# Patient Record
Sex: Female | Born: 1980 | Race: White | Hispanic: No | Marital: Married | Smoking: Never smoker
Health system: Southern US, Community
[De-identification: ages and names within clinical notes are randomized; demographics above are authoritative.]

## PROBLEM LIST (undated history)

## (undated) DIAGNOSIS — Z8659 Personal history of other mental and behavioral disorders: Secondary | ICD-10-CM

## (undated) DIAGNOSIS — Z9889 Other specified postprocedural states: Secondary | ICD-10-CM

## (undated) DIAGNOSIS — R112 Nausea with vomiting, unspecified: Secondary | ICD-10-CM

## (undated) DIAGNOSIS — Z8619 Personal history of other infectious and parasitic diseases: Secondary | ICD-10-CM

## (undated) HISTORY — PX: OTHER SURGICAL HISTORY: SHX169

## (undated) HISTORY — DX: Personal history of other mental and behavioral disorders: Z86.59

## (undated) HISTORY — DX: Personal history of other infectious and parasitic diseases: Z86.19

## (undated) HISTORY — PX: DILATION AND CURETTAGE OF UTERUS: SHX78

---
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
## 2003-12-04 ENCOUNTER — Ambulatory Visit: Payer: Self-pay | Admitting: Sports Medicine

## 2003-12-18 ENCOUNTER — Ambulatory Visit: Payer: Self-pay | Admitting: Sports Medicine

## 2003-12-31 ENCOUNTER — Ambulatory Visit: Payer: Self-pay | Admitting: Sports Medicine

## 2004-01-22 ENCOUNTER — Ambulatory Visit: Payer: Self-pay | Admitting: Sports Medicine

## 2004-02-12 ENCOUNTER — Ambulatory Visit: Payer: Self-pay | Admitting: Sports Medicine

## 2004-03-04 ENCOUNTER — Ambulatory Visit: Payer: Self-pay | Admitting: Sports Medicine

## 2004-04-08 ENCOUNTER — Ambulatory Visit: Payer: Self-pay | Admitting: Sports Medicine

## 2004-04-24 ENCOUNTER — Ambulatory Visit: Payer: Self-pay | Admitting: Sports Medicine

## 2004-05-26 ENCOUNTER — Ambulatory Visit: Payer: Self-pay | Admitting: Sports Medicine

## 2004-07-21 ENCOUNTER — Ambulatory Visit: Payer: Self-pay | Admitting: Sports Medicine

## 2004-12-01 ENCOUNTER — Ambulatory Visit: Payer: Self-pay | Admitting: Sports Medicine

## 2005-01-29 ENCOUNTER — Ambulatory Visit: Payer: Self-pay | Admitting: Sports Medicine

## 2005-08-10 ENCOUNTER — Ambulatory Visit: Payer: Self-pay | Admitting: Family Medicine

## 2006-05-09 ENCOUNTER — Telehealth (INDEPENDENT_AMBULATORY_CARE_PROVIDER_SITE_OTHER): Payer: Self-pay | Admitting: *Deleted

## 2006-05-11 ENCOUNTER — Ambulatory Visit: Payer: Self-pay | Admitting: Sports Medicine

## 2006-05-11 DIAGNOSIS — M722 Plantar fascial fibromatosis: Secondary | ICD-10-CM | POA: Insufficient documentation

## 2006-05-11 DIAGNOSIS — M2140 Flat foot [pes planus] (acquired), unspecified foot: Secondary | ICD-10-CM | POA: Insufficient documentation

## 2006-05-11 DIAGNOSIS — M66879 Spontaneous rupture of other tendons, unspecified ankle and foot: Secondary | ICD-10-CM | POA: Insufficient documentation

## 2006-10-11 ENCOUNTER — Ambulatory Visit: Payer: Self-pay | Admitting: Sports Medicine

## 2007-01-03 ENCOUNTER — Ambulatory Visit: Payer: Self-pay | Admitting: Sports Medicine

## 2007-01-03 DIAGNOSIS — M629 Disorder of muscle, unspecified: Secondary | ICD-10-CM | POA: Insufficient documentation

## 2007-02-27 ENCOUNTER — Ambulatory Visit: Payer: Self-pay | Admitting: Family Medicine

## 2007-02-27 ENCOUNTER — Telehealth (INDEPENDENT_AMBULATORY_CARE_PROVIDER_SITE_OTHER): Payer: Self-pay | Admitting: *Deleted

## 2007-03-08 ENCOUNTER — Ambulatory Visit: Payer: Self-pay | Admitting: Sports Medicine

## 2007-03-08 DIAGNOSIS — M25569 Pain in unspecified knee: Secondary | ICD-10-CM | POA: Insufficient documentation

## 2007-03-08 DIAGNOSIS — M21619 Bunion of unspecified foot: Secondary | ICD-10-CM | POA: Insufficient documentation

## 2007-07-14 ENCOUNTER — Ambulatory Visit: Payer: Self-pay | Admitting: Sports Medicine

## 2007-07-14 DIAGNOSIS — M66259 Spontaneous rupture of extensor tendons, unspecified thigh: Secondary | ICD-10-CM | POA: Insufficient documentation

## 2007-07-14 DIAGNOSIS — M76899 Other specified enthesopathies of unspecified lower limb, excluding foot: Secondary | ICD-10-CM | POA: Insufficient documentation

## 2010-03-10 NOTE — Assessment & Plan Note (Signed)
Summary: fu wp   Vital Signs:  Patient Profile:   30 Years Old Female Weight:      129 pounds Pulse rate:   60 / minute BP sitting:   104 / 68  Vitals Entered By: Lillia Pauls CMA (July 14, 2007 10:05 AM)                 Chief Complaint:  L LATERAL KNEE PAIN X 2 MOS S/P SOCCER INJURY.  History of Present Illness: Patient with hx of tackling in soccer match left leg extended felt pop on lateral side adn fell with leg in fleed position very painful and hard to straighten out completely no great amount of swelling This happened in march  took a few days off continued to run on treadmill feels unsteady and weak on left leg stil gets tight and cannot run hard on left although she has run as long as 2 hours!  no locking no giving way except on TM maybe some slight swelling feels tight        Physical Exam  General:     Well-developed,well-nourished,in no acute distress; alert,appropriate and cooperative throughout examination Head:     Normocephalic and atraumatic without obvious abnormalities. No apparent alopecia or balding. Msk:     left knee knee exam shows no effusion; stable ligaments; negative Mcmurray's and provocative meniscal tests;  however she cannot do a bounde home test or get to full extension on left althought this isnon painful  patellar compression; patellar and quadriceps tendons unremarkable.  there is some puffiness in the area above the knee along vastus lateralis about 5 cms up  Additional Exam:     MS [place] Left Knee significant fluid remains in suprapatellar pouch on left small partial tear of vast lateralis tendon where this comes into lat patella patellar and quad tendons nl  meniscus visualized pretty well along periphery and no areas look abnorm no intraarticular effusion    Impression & Recommendations:  Problem # 1:  RUPTURE, QUADRICEPS TENDON (ICD-727.65) I think she has a partial tear of vastus lateralis tendon  begin  series of quad exercises xtrain on bike  steadily rebuild vas lateralis strength  reck in 4 to 6 weeks  40 mins Orders: [facility]- Est  Level 4 (11914)   Problem # 2:  BURSITIS, LEFT KNEE (ICD-726.60) tendon injury has led to a secondary bursitis in suprapatellar pouch  needs compression for this  use ace wrap when exercising cut training to 60 % Orders: [facility]- Est  Level 4 (78295)     ]

## 2010-03-10 NOTE — Assessment & Plan Note (Signed)
Summary: fu on orthotics wp   Vital Signs:  Patient Profile:   30 Years Old Female Weight:      128 pounds Pulse rate:   61 / minute BP sitting:   113 / 71  Vitals Entered By: Lillia Pauls CMA (October 11, 2006 2:04 PM)                 Chief Complaint:  R MEDIAL ANKLE PAIN.  History of Present Illness: S: Patient is a 30 y/o female with a h/o right posterior tibilais tendon tear 4/08.  She took some time off after that and felt better.  She then started training again and was able to run in a marathon in July.  Pain below the right medial ankle started back about 2 months ago. At first the pain occured later in the day many hours after running and would cause her to limp.  Now within the last week pain has occured during her run usually around the 9th or 10th mile when she is tiring.   note training level is now at 70 miles per week whichis her highest yet and her quality workouts at low 6:15 pace or so.        Physical Exam  General:     Well-developed,well-nourished,in no acute distress; alert,appropriate and cooperative throughout examination Head:     Normocephalic and atraumatic without obvious abnormalities. No apparent alopecia or balding. Ears:     cong external deformities Msk:     R foot: pes planus and pronation on inspection right foot turns slightly inward with running very nice running gait without limp however Nl ROM TTP just inferior to medial calcaneus No pain with extension of great toe.  TTP inferior to medial malleolus in area of post tibial tendon.      Impression & Recommendations:  Problem # 1:  ANKLE TENDON RUPTURE (ICD-727.68) Assessment: Improved Imaged tendon with u/s: posterior tendon is still thickekned near the ankle joint but no longer surrounded by fluid.  There is now a nodule of scar tissue.  So overall, she appears to have healed some.  Pain is likely coming from increase wobbling during periods of fatigue.  Recommneded she  cut out one work-out a week.  Also recommeneded decreasing the pace of tempo runs.  Patient to continue icing after work-out.  Also gave her sample of Voltaren gel 0.1%.   Orders: [facility]- Est  Level 4 (16109)

## 2010-03-10 NOTE — Assessment & Plan Note (Signed)
Summary: new orthotics/per neal/el   Vital Signs:  Patient Profile:   30 Years Old Female Pulse rate:   65 / minute BP sitting:   135 / 72  Vitals Entered By: Lillia Pauls CMA (March 08, 2007 10:54 AM)                 Chief Complaint:  ORTHOTICS.  History of Present Illness: Patient is a serious distance runner training up to 80 miles per week now getting more arch pain again  more tendon irritation lt foot has bleeding and callus formation on dorsum of bunionette orthotics now > 68 year old and feel less supportive  also RT knee pain developed 24 to 48 hours after she fell on trail bumped patella and now hurts on lat upper aspect no swelling, giving way, etc        Physical Exam  General:     Well-developed,well-nourished,in no acute distress; alert,appropriate and cooperative throughout examination Head:     Normocephalic and atraumatic without obvious abnormalities. No apparent alopecia or balding. Msk:     marked collapse of tranverse arch with MTsalgia also has bunionettes bilat LT > RT with this and actually has callus and spur formation on dorsum of LT foot at 5th MTP jt long arch collapse and pronation at rest  leg length is equal  RT knee knee exam shows no effusion; stable ligaments; negative Mcmurray's and provocative meniscal tests; non painful patellar compression; patellar and quadriceps tendons unremarkable tender area at upper outer facet of patellar groove no visible bruising but hurts with flexion     Impression & Recommendations:  Problem # 1:  PES PLANUS (ICD-734)  Orders: Metatarsal pads- [facility] (E4540) [facility]- Est  Level 4 (98119) [facility]- [facility] (J4782)  Patient was fitted for a standard, cushioned, semi-rigid orthotic.  The orthotic was heated and the patient stood on the orthotic blank positioned on the orthotic stand. The patient was positioned in subtalar neutral position and 10 degrees of ankle dorsiflexion in a weight  bearing stance. After completion of molding a stable based was applied to the orthotic blank.   The blank was ground to a stable position for weight bearing. size 9 red cambray cushioned base  blue semi rigid polymer posting none additional orthotic padding  bilat MT pads   Problem # 2:  KNEE PAIN, RIGHT (ICD-719.46)  Orders: Knee Strap- [facility] (N5621)  will try to take pressure off contused area ice   Problem # 3:  BUNIONETTE (ICD-727.1) will try to support with MT pads really needs to try to adjuxt to these  Re ck in 1 to 2 mos Orders: [facility]- Est  Level 4 (30865) [facility]- Orthotic [employer] [phone])      ]

## 2010-03-10 NOTE — Progress Notes (Signed)
Summary: Orthotic issues  Phone Note Call from Patient Call back at Home Phone [phone]   Reason for Call: Talk to Nurse Summary of Call: Pt is requesting to speak with Dr. Darrick Penna nurse about her orthotics hurting her feet and possibly needing them adjusted. Initial call taken by: Haydee Salter,  February 27, 2007 9:11 AM  Follow-up for Phone Call        pt to come in so dr neal came make adjustments as need Follow-up by: Lillia Pauls CMA,  February 27, 2007 2:01 PM         Appended Document: Orthotic issues      History of Present Illness: had orthotics made some time 26 y? ago). Worked OK. ran regularly and then not so regularly for a while. last several weeks has restarted exercise and is haiving terrible foot pain.        Physical Exam  1st MCP joint has small ulcerated lesion w some surrounding erythema--not true cellulitis.    Impression & Recommendations:  Problem # 1:  PES PLANUS (ICD-734) orthotics need to be revised but she needs to  let this soft tissue ulcer heal first. No running 5 days, we put donut bandage on joint, topical abx 3 days and then f/u next week for new orthotics.   Patient Instructions: 1)  appt with dr fields on wednesday 03/08/07 at 10:30am    ]  Appended Document: no charge    Clinical Lists Changes  Orders: Added new Service order of No Charge Patient Arrived (NCPA0) (NCPA0) - Signed

## 2010-03-10 NOTE — Assessment & Plan Note (Signed)
Summary: FU/KH   Vital Signs:  Patient Profile:   31 Years Old Female Weight:      127 pounds Pulse rate:   73 / minute BP sitting:   105 / 66  Vitals Entered By: Lillia Pauls CMA (May 11, 2006 9:19 AM)               Chief Complaint:  R ANKLE INJURY X SUN.  History of Present Illness: Mackenzie Delgado is a 30 yr old F runner who presents today with R heel pain X several months and soreness/bruising posterior to her R medial malleolus.  She ran a 1/2 marathon on 3/30 without difficulty, and than played a soccer game.  Her heel became worse and after the game, she noticed the bruising and soreness in her medial ankle.  Her heel pain is localized to her medial right heel, starts with a stabbing pain for the first few minutes of her run, then goes away.  Afterwards, she often has heel pain/soreness for the rest of the day.  She wears orthotics in her running shoes, but not in her cleats or her racing shoes.  She was able to complete a 12 mile run yesterday without much difficulty.  No meds, some icing.  No weakness, numbness, or tingling.  She is currently running 54mi/wk and training for a marathon on May 4th.    Past Medical History:    Hx of L piriformis syndrome    Hx of L IT band syndrome      Physical Exam  General:     Well appearing F in NAD Msk:     R foot: pes planus and pronation on inspection Nl ROM TTP just posterior to medial calcaneus at the insertion of the plantar fascia.   No pain with extension of great toe.  TTP posterior to medial malleolus in area of post tibial tendon.  Not TTP at PT insertion or proximal to malleolus.  Additional Exam:     Soft tissue Ultrasound evaluation:  Posterior tibilais tendon- Ultrasound evaluation with pt in seated position of the R posterior tibialis tendon this was tracked along the course of the posterior tibial groove and on along the tarsal tunnel and the insertion of the tendon on the navicular.  Measurement of post tib  tendon was .36mm near the site of attachment to the navicular and proximal to the tear.  An intratedinous defect and fluid collection was appreciated just posterior and underneath the medial malleolus and the tendon measured .44mm suggesting an approximately 20% increase in tendon size.  This was compared to left Post tib tendon which showed none of the pathological findings noted above   Plantar fascia- Ultrasound evaluation of R plantar fascia with pt in seated position.  Visualized attachment point to calcaneus, and measured .44mm at thickest point.  This is in excess of normal greatest thickness of .40 representing mild swelling    Impression & Recommendations:  Problem # 1:  ANKLE TENDON RUPTURE (ICD-727.68) Assessment: New Pt likely has  ~20% tear by ultrasound of her post tibilais tendon posterior to her medial malleolus.  Went over training plan with taper for her upcoming marathon.  Pt told to decrease activity if that area becomes sore.  She is to ice it after each run.  She was given exercises to do including standing pigeon toe raises, then drop raises on a step- first using 1 leg to go down and 2 legs to get up progressing to using 1 leg  down and 1 leg up. She is to return if her symptoms worsen, and was counseled on the possibilty of complete tendon rupture.  Pt verbalized agreement with plan.  max training 45 miles this  week/ 50 next wk and limt long run to 20 mi 40 miles 3rd wk and limit LR to 14 mi taper 4th wk Orders: [facility]- Est  Level 4 (16109)   Problem # 2:  PLANTAR FASCIITIS (ICD-728.71) Assessment: New Pt counseled to use orthotics in her shoes for longer runs- which will neccessitate new shoes in her case.   Orders: [facility]- Est  Level 4 (60454)   Problem # 3:  PES PLANUS (ICD-734) Assessment: Unchanged Orthotic corrections made to give more cushion in the heel and along medial aspect of orthotic.  Orders: [facility]- Est  Level 4 (09811)

## 2010-03-10 NOTE — Assessment & Plan Note (Signed)
Summary: knee injury/el   Vital Signs:  Patient Profile:   30 Years Old Female Pulse rate:   57 / minute BP sitting:   120 / 72  Vitals Entered By: Lillia Pauls CMA (January 03, 2007 3:14 PM)                 Chief Complaint:  recurrent IT BAND issues on L X 3 WKS.  History of Present Illness: Pt. c/o 3 weeks of left IT band pain that occurred while running the [employer]. Pt. felt tightness diffusely around mile 13-14, but she finished the race despite having to walk a bit. She now feels pain with even small amounts of running or even water running. Pt. has been doing strengthening exercises for this problem using recommended exercises for her IT band syndrome that she had years ago.        Physical Exam  General:     alert and well-developed.     Knee Exam  Skin:    Intact, no scars, lesions, rashes, cafe au lait spots, or bruising.    Inspection:     No deformity, ecchymosis or swelling.   Palpation:    Non-tender to palpation over medial joint line, lateral joint line, parapatellar, condylar, patellar tendon, or Pes bursa.   Special tests:    Neg. McMurray testing on left  Anterior drawer:    Left negative Posterior drawer:    Left negative MCL:    Left negative LCL:    Left negative   Hip Exam  Skin:    Intact, no scars,lesions,rashes,cafe au lait spots,bruising.  Inspection:    No deformity, ecchymosis or swelling.   Palpation:    ttp over hip external rotators on the left as well as the left IT band approximately midway down the thigh mild decrease in strength left hip rotators and slight tendernessover piriformis   Pt. has only very mildly decreased hip abd. strength on her affected side compared to the unaffected side. Pt. seems to have a slightly abd. anterior tilt to her pelvis. Pt.'s running gait was assessed. She has a perfect running form, and there were no abnormalities noted.      Impression &  Recommendations:  Problem # 1:  ITBS, LEFT KNEE (ICD-728.89) IT band syndrome of the left side. this had occurred greater than 2 years ago but had not flared since suspect cold/  prolonged sitting before marathon both contributed as she started race tight  resume strength and stretching program try to ease back into running afrer 3 more weeks of rest Orders: [facility]- Est Level  3 (16109)    Patient Instructions: 1)  Pt. is to discontinue running for 3 weeks. She is to do the recommended IT band strengthening/stretching programs. Pt. is to do the exercise bike and take approximately 2 yoga classes a week.    ]

## 2010-03-10 NOTE — Progress Notes (Signed)
Summary: Appt  Phone Note Call from Patient Call back at Home Phone [phone]   Reason for Call: Talk to Nurse Summary of Call: pt is needing to be seen by Dr Darrick Penna asap, has a marathon in a couple of weeks and thinks she has injured her foot Initial call taken by: Haydee Salter,  May 09, 2006 8:42 AM  Follow-up for Phone Call        APPT MADE WITH [facility]ETT ON 4.2.8 @ 9:30 Follow-up by: Lillia Pauls CMA,  May 09, 2006 12:15 PM

## 2016-02-09 NOTE — L&D Delivery Note (Signed)
Delivery Note Patient pushed well for < 10 minutes.  At 4:51 PM a viable female was delivered via Vaginal, Spontaneous (Presentation: OA ).  APGAR: 8, 9; weight 5 lb 13.3 oz (2645 g).   Placenta status: Spontaneous, in tact .  Cord: 3V with the following complications: None.  Cord pH: n/a  Anesthesia:  None Episiotomy: None Lacerations: Vaginal Suture Repair: 3.0 vicryl rapide figure of eight stitch Est. Blood Loss (mL): 200  Mom to postpartum.  Baby to Couplet care / Skin to Skin.  [facility] Medical CenterDYANNA Delgado [name] 12/29/2016, 7:58 PM

## 2016-07-18 LAB — OB RESULTS CONSOLE ANTIBODY SCREEN: Antibody Screen: NEGATIVE

## 2016-07-18 LAB — OB RESULTS CONSOLE HEPATITIS B SURFACE ANTIGEN: Hepatitis B Surface Ag: NEGATIVE

## 2016-07-18 LAB — OB RESULTS CONSOLE RUBELLA ANTIBODY, IGM: Rubella: IMMUNE

## 2016-07-18 LAB — OB RESULTS CONSOLE GC/CHLAMYDIA
Chlamydia: NEGATIVE
Gonorrhea: NEGATIVE

## 2016-07-18 LAB — OB RESULTS CONSOLE ABO/RH: RH Type: POSITIVE

## 2016-07-18 LAB — OB RESULTS CONSOLE RPR: RPR: NONREACTIVE

## 2016-07-18 LAB — OB RESULTS CONSOLE HIV ANTIBODY (ROUTINE TESTING): HIV: NONREACTIVE

## 2016-12-09 LAB — OB RESULTS CONSOLE GBS: GBS: NEGATIVE

## 2016-12-29 ENCOUNTER — Other Ambulatory Visit: Payer: Self-pay

## 2016-12-29 ENCOUNTER — Inpatient Hospital Stay (HOSPITAL_COMMUNITY): Payer: BC Managed Care – PPO | Admitting: Anesthesiology

## 2016-12-29 ENCOUNTER — Encounter (HOSPITAL_COMMUNITY): Payer: Self-pay | Admitting: Anesthesiology

## 2016-12-29 ENCOUNTER — Encounter (HOSPITAL_COMMUNITY): Payer: Self-pay | Admitting: *Deleted

## 2016-12-29 ENCOUNTER — Inpatient Hospital Stay (HOSPITAL_COMMUNITY)
Admission: AD | Admit: 2016-12-29 | Discharge: 2016-12-30 | DRG: 807 | Disposition: A | Discharge status: Home / Self Care | Payer: BC Managed Care – PPO | Source: Ambulatory Visit | Attending: Obstetrics | Admitting: Obstetrics

## 2016-12-29 ENCOUNTER — Inpatient Hospital Stay (HOSPITAL_COMMUNITY)
Admission: AD | Admit: 2016-12-29 | Discharge: 2016-12-29 | Discharge status: Absent without leave | Payer: Self-pay | Attending: Obstetrics | Admitting: Obstetrics

## 2016-12-29 DIAGNOSIS — Z3A37 37 weeks gestation of pregnancy: Secondary | ICD-10-CM

## 2016-12-29 DIAGNOSIS — Z3483 Encounter for supervision of other normal pregnancy, third trimester: Secondary | ICD-10-CM | POA: Diagnosis present

## 2016-12-29 HISTORY — DX: Nausea with vomiting, unspecified: R11.2

## 2016-12-29 HISTORY — DX: Other specified postprocedural states: Z98.890

## 2016-12-29 LAB — TYPE AND SCREEN
ABO/RH(D): B POS
Antibody Screen: NEGATIVE

## 2016-12-29 LAB — CBC
HCT: 42.2 % (ref 36.0–46.0)
Hemoglobin: 14.5 g/dL (ref 12.0–15.0)
MCH: 31.1 pg (ref 26.0–34.0)
MCHC: 34.4 g/dL (ref 30.0–36.0)
MCV: 90.6 fL (ref 78.0–100.0)
Platelets: 156 10*3/uL (ref 150–400)
RBC: 4.66 MIL/uL (ref 3.87–5.11)
RDW: 12.6 % (ref 11.5–15.5)
WBC: 12 10*3/uL — ABNORMAL HIGH (ref 4.0–10.5)

## 2016-12-29 LAB — ABO/RH: ABO/RH(D): B POS

## 2016-12-29 MED ORDER — OXYCODONE HCL 5 MG PO TABS: 5.0000 mg | ORAL_TABLET | ORAL | Status: DC | PRN

## 2016-12-29 MED ORDER — WITCH HAZEL-GLYCERIN EX PADS: 1.0000 "application " | MEDICATED_PAD | CUTANEOUS | Status: DC | PRN

## 2016-12-29 MED ORDER — PHENYLEPHRINE 40 MCG/ML (10ML) SYRINGE FOR IV PUSH (FOR BLOOD PRESSURE SUPPORT)
80.0000 ug | PREFILLED_SYRINGE | INTRAVENOUS | Status: DC | PRN
Start: 2016-12-29 — End: 2016-12-29
  Filled 2016-12-29: qty 5
  Filled 2016-12-29: qty 10

## 2016-12-29 MED ORDER — FENTANYL 2.5 MCG/ML BUPIVACAINE 1/10 % EPIDURAL INFUSION (WH - ANES)
14.0000 mL/h | INTRAMUSCULAR | Status: DC | PRN
  Administered 2016-12-29 (×2): 14 mL/h via EPIDURAL
  Filled 2016-12-29: qty 100

## 2016-12-29 MED ORDER — COCONUT OIL OIL: 1.0000 "application " | TOPICAL_OIL | Status: DC | PRN

## 2016-12-29 MED ORDER — ONDANSETRON HCL 4 MG PO TABS: 4.0000 mg | ORAL_TABLET | ORAL | Status: DC | PRN

## 2016-12-29 MED ORDER — OXYCODONE-ACETAMINOPHEN 5-325 MG PO TABS: 1.0000 | ORAL_TABLET | ORAL | Status: DC | PRN

## 2016-12-29 MED ORDER — OXYTOCIN 40 UNITS IN LACTATED RINGERS INFUSION - SIMPLE MED
2.5000 [IU]/h | INTRAVENOUS | Status: DC
  Filled 2016-12-29: qty 1000

## 2016-12-29 MED ORDER — EPHEDRINE 5 MG/ML INJ
10.0000 mg | INTRAVENOUS | Status: DC | PRN
  Filled 2016-12-29: qty 2

## 2016-12-29 MED ORDER — LIDOCAINE HCL (PF) 1 % IJ SOLN
30.0000 mL | INTRAMUSCULAR | Status: DC | PRN
  Filled 2016-12-29: qty 30

## 2016-12-29 MED ORDER — TETANUS-DIPHTH-ACELL PERTUSSIS 5-2.5-18.5 LF-MCG/0.5 IM SUSP: 0.5000 mL | Freq: Once | INTRAMUSCULAR | Status: DC

## 2016-12-29 MED ORDER — ONDANSETRON HCL 4 MG/2ML IJ SOLN: 4.0000 mg | Freq: Four times a day (QID) | INTRAMUSCULAR | Status: DC | PRN

## 2016-12-29 MED ORDER — OXYCODONE-ACETAMINOPHEN 5-325 MG PO TABS: 2.0000 | ORAL_TABLET | ORAL | Status: DC | PRN

## 2016-12-29 MED ORDER — PHENYLEPHRINE 40 MCG/ML (10ML) SYRINGE FOR IV PUSH (FOR BLOOD PRESSURE SUPPORT)
80.0000 ug | PREFILLED_SYRINGE | INTRAVENOUS | Status: DC | PRN
  Filled 2016-12-29: qty 5

## 2016-12-29 MED ORDER — DIPHENHYDRAMINE HCL 25 MG PO CAPS: 25.0000 mg | ORAL_CAPSULE | Freq: Four times a day (QID) | ORAL | Status: DC | PRN

## 2016-12-29 MED ORDER — IBUPROFEN 600 MG PO TABS
600.0000 mg | ORAL_TABLET | Freq: Four times a day (QID) | ORAL | Status: DC
  Administered 2016-12-29 – 2016-12-30 (×4): 600 mg via ORAL
  Filled 2016-12-29 (×5): qty 1

## 2016-12-29 MED ORDER — BENZOCAINE-MENTHOL 20-0.5 % EX AERO: 1.0000 "application " | INHALATION_SPRAY | CUTANEOUS | Status: DC | PRN

## 2016-12-29 MED ORDER — ONDANSETRON HCL 4 MG/2ML IJ SOLN: 4.0000 mg | INTRAMUSCULAR | Status: DC | PRN

## 2016-12-29 MED ORDER — DIBUCAINE 1 % RE OINT: 1.0000 "application " | TOPICAL_OINTMENT | RECTAL | Status: DC | PRN

## 2016-12-29 MED ORDER — OXYCODONE HCL 5 MG PO TABS
10.0000 mg | ORAL_TABLET | ORAL | Status: DC | PRN
  Filled 2016-12-29: qty 2

## 2016-12-29 MED ORDER — SIMETHICONE 80 MG PO CHEW: 80.0000 mg | CHEWABLE_TABLET | ORAL | Status: DC | PRN

## 2016-12-29 MED ORDER — ACETAMINOPHEN 325 MG PO TABS: 650.0000 mg | ORAL_TABLET | ORAL | Status: DC | PRN

## 2016-12-29 MED ORDER — LIDOCAINE HCL (PF) 1 % IJ SOLN
INTRAMUSCULAR | Status: DC | PRN
  Administered 2016-12-29: 13 mL via EPIDURAL

## 2016-12-29 MED ORDER — SENNOSIDES-DOCUSATE SODIUM 8.6-50 MG PO TABS: 2.0000 | ORAL_TABLET | ORAL | Status: DC

## 2016-12-29 MED ORDER — LACTATED RINGERS IV SOLN: 500.0000 mL | INTRAVENOUS | Status: DC | PRN

## 2016-12-29 MED ORDER — OXYTOCIN BOLUS FROM INFUSION
500.0000 mL | Freq: Once | INTRAVENOUS | Status: AC
  Administered 2016-12-29: 500 mL via INTRAVENOUS

## 2016-12-29 MED ORDER — PRENATAL MULTIVITAMIN CH
1.0000 | ORAL_TABLET | Freq: Every day | ORAL | Status: DC
  Administered 2016-12-30: 1 via ORAL
  Filled 2016-12-29: qty 1

## 2016-12-29 MED ORDER — LACTATED RINGERS IV SOLN
500.0000 mL | Freq: Once | INTRAVENOUS | Status: AC
  Administered 2016-12-29: 500 mL via INTRAVENOUS

## 2016-12-29 MED ORDER — FENTANYL CITRATE (PF) 100 MCG/2ML IJ SOLN: 50.0000 ug | INTRAMUSCULAR | Status: DC | PRN

## 2016-12-29 MED ORDER — SOD CITRATE-CITRIC ACID 500-334 MG/5ML PO SOLN: 30.0000 mL | ORAL | Status: DC | PRN

## 2016-12-29 MED ORDER — DIPHENHYDRAMINE HCL 50 MG/ML IJ SOLN: 12.5000 mg | INTRAMUSCULAR | Status: DC | PRN

## 2016-12-29 MED ORDER — LACTATED RINGERS IV SOLN
INTRAVENOUS | Status: DC
  Administered 2016-12-29: 125 mL via INTRAVENOUS
  Administered 2016-12-29: 14:00:00 via INTRAVENOUS

## 2016-12-29 NOTE — Anesthesia Preprocedure Evaluation (Signed)

## 2016-12-29 NOTE — H&P (Signed)
36 y.o. G3P1011 @ 1833w6d presents with painful contractions.  Otherwise has good fetal movement and no bleeding.  1.  First child was SGA at birth.  At 35 weeks, EFW was 32%.  Growth US was scheduled for today, but pt presented in labor  Past Medical History:  Diagnosis Date  . PONV (postoperative nausea and vomiting)     Past Surgical History:  Procedure Laterality Date  . DILATION AND CURETTAGE OF UTERUS    . OTHER SURGICAL HISTORY     multiple ear surgeries as a child    OB History  Gravida Para Term Preterm AB Living  3 1 1   1 1   SAB TAB Ectopic Multiple Live Births  1       1    # Outcome Date GA Lbr Len/2nd Weight Sex Delivery Anes PTL Lv  3 Current           2 SAB           1 Term     F Vag-Spont EPI N LIV    Obstetric Comments  #1- induced for IUGR    Social History   Socioeconomic History  . Marital status: Married    Spouse name: Not on file  . Number of children: Not on file  . Years of education: Not on file  . Highest education level: Not on file  Social Needs  . Financial resource strain: Not on file  . Food insecurity - worry: Not on file  . Food insecurity - inability: Not on file  . Transportation needs - medical: Not on file  . Transportation needs - non-medical: Not on file  Occupational History  . Not on file  Tobacco Use  . Smoking status: Never Smoker  . Smokeless tobacco: Never Used  Substance and Sexual Activity  . Alcohol use: Yes    Comment: not with preg  . Drug use: No  . Sexual activity: Not on file  Other Topics Concern  . Not on file  Social History Narrative  . Not on file   Patient has no known allergies.    Prenatal Transfer Tool  Maternal Diabetes: No Genetic Screening: Normal low risk Materni T21 Maternal Ultrasounds/Referrals: Normal Fetal Ultrasounds or other Referrals:  None Maternal Substance Abuse:  No Significant Maternal Medications:  None Significant Maternal Lab Results: None  ABO, Rh: --/--/B POS (11/21  1053) Antibody: NEG (11/21 1053) Rubella: Immune (06/10 0000) RPR: Nonreactive (06/10 0000)  HBsAg: Negative (06/10 0000)  HIV: Non-reactive (06/10 0000)  GBS: Negative (11/01 0000)      Vitals:   12/29/16 1225 12/29/16 1231  BP: 131/71 128/71  Pulse: 82 81  Resp:  20  Temp:    SpO2: 100%      General:  NAD Abdomen:  soft, gravid, EFW 5.5# Ex:  no edema SVE:  6/100/-1 FHTs:  120s, mod var, + accels Toco:  q3-5 minutes   A/P   36 y.o. G3P1011 7533w6d presents with labor Admit to L&D Comfortable w epidural FSR/ vtx/ GBS neg  Mackenzie Delgado Mackenzie Delgado

## 2016-12-29 NOTE — Anesthesia Postprocedure Evaluation (Signed)
Anesthesia Post Note  Patient: Alfred LevinsJennifer Vanwyk  Procedure(s) Performed: AN AD HOC LABOR EPIDURAL     Patient location during evaluation: Mother Baby Anesthesia Type: Epidural Level of consciousness: awake and alert Pain management: pain level controlled Vital Signs Assessment: post-procedure vital signs reviewed and stable Respiratory status: spontaneous breathing, nonlabored ventilation and respiratory function stable Cardiovascular status: stable Postop Assessment: no headache, no backache and epidural receding Anesthetic complications: no    Last Vitals:  Vitals:   12/29/16 1830 12/29/16 1902  BP: 125/81 124/78  Pulse: 62 78  Resp: 18 18  Temp:  36.4 C  SpO2:  99%    Last Pain:  Vitals:   12/29/16 1902  TempSrc: Oral  PainSc: 0-No pain   Pain Goal:                 Roxane Puerto

## 2016-12-29 NOTE — Anesthesia Pain Management Evaluation Note (Signed)
  CRNA Pain Management Visit Note  Patient: Mackenzie LevinsJennifer Delgado, 36 y.o., female  "Hello I am a member of the anesthesia [facility]. We have an anesthesia team available at all times to provide care throughout the hospital, including epidural management and anesthesia for C-section. I don't know your plan for the delivery whether it a natural birth, water birth, IV sedation, nitrous supplementation, doula or epidural, but we want to meet your pain goals."   1.Was your pain managed to your expectations on prior hospitalizations?   No prior hospitalizations  2.What is your expectation for pain management during this hospitalization?     Epidural  3.How can we help you reach that goal? epid  Record the patient's initial score and the patient's pain goal.   Pain: 0  Pain Goal: 4 [facility] wants you to be able to say your pain was always managed very well.  Thelton Graca 12/29/2016

## 2016-12-29 NOTE — Anesthesia Procedure Notes (Signed)
Epidural Patient location during procedure: OB Start time: 12/29/2016 11:58 AM End time: 12/29/2016 12:15 PM  Staffing Anesthesiologist: Lowella CurbMiller, Devine Dant Ray, MD Performed: anesthesiologist   Preanesthetic Checklist Completed: patient identified, site marked, surgical consent, pre-op evaluation, timeout performed, IV checked, risks and benefits discussed and monitors and equipment checked  Epidural Patient position: sitting Prep: ChloraPrep Patient monitoring: heart rate, cardiac monitor, continuous pulse ox and blood pressure Approach: midline Location: L2-L3 Injection technique: LOR saline  Needle:  Needle type: Tuohy  Needle gauge: 17 G Needle length: 9 cm Needle insertion depth: 4 cm Catheter type: closed end flexible Catheter size: 20 Guage Catheter at skin depth: 7 cm Test dose: negative  Assessment Events: blood not aspirated, injection not painful, no injection resistance, negative IV test and no paresthesia  Additional Notes Reason for block:procedure for pain

## 2016-12-29 NOTE — MAU Note (Signed)
Contractions started last night, getting closer and stronger.  Measuring small.  No bleeding or leaking.

## 2016-12-30 ENCOUNTER — Other Ambulatory Visit: Payer: Self-pay

## 2016-12-30 ENCOUNTER — Ambulatory Visit: Payer: Self-pay

## 2016-12-30 LAB — CBC
HCT: 38 % (ref 36.0–46.0)
Hemoglobin: 12.9 g/dL (ref 12.0–15.0)
MCH: 30.8 pg (ref 26.0–34.0)
MCHC: 33.9 g/dL (ref 30.0–36.0)
MCV: 90.7 fL (ref 78.0–100.0)
Platelets: 161 10*3/uL (ref 150–400)
RBC: 4.19 MIL/uL (ref 3.87–5.11)
RDW: 12.6 % (ref 11.5–15.5)
WBC: 11.9 10*3/uL — ABNORMAL HIGH (ref 4.0–10.5)

## 2016-12-30 LAB — RPR: RPR Ser Ql: NONREACTIVE

## 2016-12-30 NOTE — Addendum Note (Signed)
Addendum  created 12/30/16 0750 by Algis GreenhouseBurger, Criag Wicklund A, CRNA   Charge Capture section accepted, Sign clinical note

## 2016-12-30 NOTE — Progress Notes (Signed)
Nurse at bedside.  Pt encouraged to pump breast and give infant ebm after feeding due to birth wt.  Pt states "I don't want to,  I'd rather do hand expression and give EBM that way."  Sig other at bedside.

## 2016-12-30 NOTE — Lactation Note (Signed)
This note was copied from a baby's chart. Lactation Consultation Note  Patient Name: Mackenzie Alfred LevinsJennifer Sher HQION'GToday's Date: 12/30/2016 Reason for consult: Follow-up assessment;Infant < 6lbs Baby is now 5321 hours old.  Attempted latch but baby is very sleepy and not showing interest in feeding.  Explained to mom that due to sleepiness and low weight pumping with a DEBP is important to provide stimulation and calories for baby.  Symphony pump set up and initiated.  Mom pumped 7 mls of colostrum.  Baby took 4.5 mls per dropper.  Instructed mom to feed with any feeding cue at least every 2 1/2-3 hours and post pump.  Give expressed milk back to baby with dropper or syringe.  Call with concerns/assist prn.  Maternal Data    Feeding Feeding Type: Breast Fed  LATCH Score Latch: Too sleepy or reluctant, no latch achieved, no sucking elicited.  Audible Swallowing: None  Type of Nipple: Everted at rest and after stimulation  Comfort (Breast/Nipple): Soft / non-tender  Hold (Positioning): Assistance needed to correctly position infant at breast and maintain latch.  LATCH Score: 5  Interventions    Lactation Tools Discussed/Used Pump Review: Setup, frequency, and cleaning;Milk Storage Initiated by:: LC Date initiated:: 12/30/16   Consult Status Consult Status: Follow-up Date: 12/31/16 Follow-up type: In-patient    Huston FoleyMOULDEN, Koy Lamp S 12/30/2016, 2:47 PM

## 2016-12-30 NOTE — Discharge Summary (Signed)
Obstetric Discharge Summary Reason for Admission: onset of labor Prenatal Procedures: ultrasound Intrapartum Procedures: spontaneous vaginal delivery Postpartum Procedures: none Complications-Operative and Postpartum: vaginal laceration Hemoglobin  Date Value Ref Range Status  12/30/2016 12.9 12.0 - 15.0 g/dL Final   HCT  Date Value Ref Range Status  12/30/2016 38.0 36.0 - 46.0 % Final    Physical Exam:  General: alert, cooperative and appears stated age Lochia: appropriate    Discharge Diagnoses: Term Pregnancy-delivered  Discharge Information: Date: 12/30/2016 Activity: pelvic rest Diet: routine Medications: PNV and Ibuprofen Condition: stable Instructions: refer to practice specific booklet Discharge to: home Follow-up Information    Marlow Baarslark, Dyanna, MD. Schedule an appointment as soon as possible for a visit in 1 month(s).   Specialty:  Obstetrics Contact information: [address] [place] [address] [phone]           Newborn Data: Live born female  Birth Weight: 5 lb 13.3 oz (2645 g) APGAR: 8, 9  Newborn Delivery   Birth date/time:  12/29/2016 16:51:00 Delivery type:  Vaginal, Spontaneous     Home with mother.  Donevan Biller E 12/30/2016, 11:02 AM

## 2016-12-30 NOTE — Lactation Note (Signed)
This note was copied from a baby's chart. Lactation Consultation Note  Patient Name: Mackenzie Alfred LevinsJennifer Delgado WUJWJ'XToday's Date: 12/30/2016 Reason for consult: Follow-up assessment;Infant < 6lbs Mom called out for latch assist.  Baby awake and showing feeding cues.  Positioned baby skin to skin in football on right and cross cradle on left.  Mom can easily hand express several drops of colostrum into baby's mouth.  Baby latches with a fairly shallow latch due to small mouth.  Mom has small breasts with good erect nipple.  Demonstrated waking techniques and breast massage during feeding.  Discussed adding some pumping if baby becomes sleepy.  Encouraged to call out for assist/concerns.  Maternal Data Has patient been taught Hand Expression?: Yes Does the patient have breastfeeding experience prior to this delivery?: Yes  Feeding Feeding Type: Breast Fed Length of feed: 15 min  LATCH Score Latch: Grasps breast easily, tongue down, lips flanged, rhythmical sucking.  Audible Swallowing: A few with stimulation  Type of Nipple: Everted at rest and after stimulation  Comfort (Breast/Nipple): Soft / non-tender  Hold (Positioning): Assistance needed to correctly position infant at breast and maintain latch.  LATCH Score: 8  Interventions Interventions: Breast feeding basics reviewed;Assisted with latch;Breast compression;Skin to skin;Adjust position;Breast massage;Support pillows;Hand express;Position options  Lactation Tools Discussed/Used     Consult Status Consult Status: Follow-up Date: 12/31/16 Follow-up type: In-patient    Huston FoleyMOULDEN, [name] 12/30/2016, 10:16 AM

## 2016-12-30 NOTE — Anesthesia Postprocedure Evaluation (Signed)
Anesthesia Post Note  Patient: Mackenzie LevinsJennifer Delgado  Procedure(s) Performed: AN AD HOC LABOR EPIDURAL     Patient location during evaluation: Mother Baby Anesthesia Type: Epidural Level of consciousness: awake Pain management: satisfactory to patient Vital Signs Assessment: post-procedure vital signs reviewed and stable Respiratory status: spontaneous breathing Cardiovascular status: stable Anesthetic complications: no    Last Vitals:  Vitals:   12/29/16 2024 12/30/16 0015  BP: 119/71 117/61  Pulse: 72 62  Resp: 18 18  Temp:  36.9 C  SpO2:      Last Pain:  Vitals:   12/30/16 0015  TempSrc: Oral  PainSc:    Pain Goal:                 KeyCorpBURGER,[name]

## 2016-12-30 NOTE — Lactation Note (Signed)
This note was copied from a baby's chart. Lactation Consultation Note  Patient Name: Mackenzie Alfred LevinsJennifer Nogueira [id number]'WToday's Date: 12/30/2016 Reason for consult: Follow-up assessment  Baby 27 hours old. Mom called for assistance with latching baby. Mom attempting to latch baby to left breast in cradle position, but baby bobbing around the nipple and not able to latch. Assisted mom to support baby's head and her breast in cross-cradle position and baby latched deeply and suckled rhythmically with some swallows noted. Mom easily expressible with milk flowing bilaterally. Enc mom to compress breast to direct more milk into baby's mouth, and baby would suckle again with each compression. Enc mom to put baby to breast with cues and at least by 3 hours--waking as needed. Enc FOB to supplement baby with EBM using curve-tipped syringe--enc calling for assistance with syringe use as needed. Enc mom to continue post-pumping and hand expressing while baby being supplement by FOB.   Mom using DEBP when this LC leaving the room and mom flowing well. Parents given supplementation guidelines with review--and enc giving 7 ml of EBM at next BF.   Maternal Data    Feeding Feeding Type: Breast Fed Length of feed: 15 min  LATCH Score Latch: Grasps breast easily, tongue down, lips flanged, rhythmical sucking.  Audible Swallowing: A few with stimulation  Type of Nipple: Everted at rest and after stimulation  Comfort (Breast/Nipple): Soft / non-tender  Hold (Positioning): Assistance needed to correctly position infant at breast and maintain latch.  LATCH Score: 8  Interventions Interventions: Breast feeding basics reviewed;Assisted with latch;Skin to skin;Hand express;Adjust position;Breast compression;Support pillows;Position options;Expressed milk  Lactation Tools Discussed/Used Tools: Pump Breast pump type: Manual   Consult Status Consult Status: Follow-up Date: 12/31/16 Follow-up type:  In-patient    Mackenzie HayJennifer D Znya Albino 12/30/2016, 7:53 PM

## 2016-12-30 NOTE — Progress Notes (Signed)
PPD#1 Pt without complaints. Would like to go home VSSAF IMP/ Stable Plan/ Will discharge 

## 2016-12-30 NOTE — Lactation Note (Signed)
This note was copied from a baby's chart. Lactation Consultation Note  Patient Name: Mackenzie Alfred LevinsJennifer Delgado [id number]'WToday's Date: 12/30/2016 Reason for consult: Initial assessment;Early term 37-38.6wks;Infant < 6lbs Breastfeeding consultation services and support information given and reviewed.  This is mom's second baby and newborn is 6915 hours old.  Baby has been very sleepy since birth.  Spoon fed colostrum twice.  Instructed to do skin to skin and watch for feeding cues.  Parents will call out for latch assist with cues.  Maternal Data Has patient been taught Hand Expression?: Yes Does the patient have breastfeeding experience prior to this delivery?: Yes  Feeding Feeding Type: Breast Milk Length of feed: (few drops)  LATCH Score                   Interventions    Lactation Tools Discussed/Used     Consult Status Consult Status: Follow-up Date: 12/30/16 Follow-up type: In-patient    Huston FoleyMOULDEN, Emiya Loomer S 12/30/2016, 8:52 AM

## 2016-12-31 ENCOUNTER — Ambulatory Visit: Payer: Self-pay

## 2016-12-31 NOTE — Lactation Note (Addendum)
This note was copied from a baby's chart. Lactation Consultation Note  Patient Name: Mackenzie Delgado [id number]'s Date: 12/31/2016 Reason for consult: Follow-up assessment   Baby 40 hours old.  Baby < 6 lbs.  Baby has had difficulty sustaining latch. 7.2% weight loss.  4% last night. Suggest parents follow LPI volume guidelines for supplementation to increase volume and feeding guidelines. Attempted w/ #20 & #24 NS but baby did not open wide enough.  Sleepy at breast. Mother has been pumped approx 15 min after feeding attempts w/ DEBP and is expressing approx 45 ml per session. Mother's breasts are filling and states she has been engorged. Had mother lie flat and did reverse pressure softening. Had mother apply ice top and bottom to breasts. Discussed applying cabbage leaves 1 time per day. Mother is pumping after feedings for 15-20 min. Mom encouraged to feed baby 8-12 times/24 hours and with feeding cues at least q 3 hours.  Reviewed engorgement care and monitoring voids/stools. Previous RN Rosey Batheresa discussed tight frenulum with parents.  Suggest discussed with Pediatrician.   Recommend mother consider OP appt to work on latching since baby is not opening wide.      Maternal Data    Feeding Feeding Type: Breast Fed Length of feed: 0 min(few sucks)  LATCH Score Latch: Repeated attempts needed to sustain latch, nipple held in mouth throughout feeding, stimulation needed to elicit sucking reflex.  Audible Swallowing: A few with stimulation  Type of Nipple: Everted at rest and after stimulation  Comfort (Breast/Nipple): Soft / non-tender  Hold (Positioning): Assistance needed to correctly position infant at breast and maintain latch.  LATCH Score: 7  Interventions    Lactation Tools Discussed/Used     Consult Status Consult Status: Follow-up Date: 01/01/17 Follow-up type: In-patient    [name], Mackenzie Delgado 12/31/2016, 9:38 AM

## 2016-12-31 NOTE — Lactation Note (Signed)
This note was copied from a baby's chart. Lactation Consultation Note  Patient Name: Girl Alfred LevinsJennifer Rochel BJYNW'GToday's Date: 12/31/2016 Reason for consult: Follow-up assessment  Mother's nipples evert and compressible.  R is slightly smaller than L nipple. Returned to room to view latch.  Baby < 6 lbs and has not been sustaining latch.  4% weight loss last night. Advised parents to increase breastmilk volume and keep increasing as baby desires and per day of life. Mother has good milk supply.  Her breasts are filling.  She has been post pumping q 2- 3 hours. Attempted latching on both breasts in cross cradle hold.  Baby sucked a few times and then fell asleep. Had mother compress breasts during feeding to keep baby active.  No sucking bursts elicited. Had FOB give baby breastmilk w/ slow flow nipple bottle with no problems. Encouraged mother to continuing pumping and giving volume to baby after attempting at the breast with or without NS. Taught mother how to prefill NS with curved tip syringe. Assisted w/ pumping because mother has been complaining of discomfort. #24 flanges seem to fit mother well.  Reducing the suction strength improved comfort. Suggest mother lubricate flanges w/ coconut oil. Massaged breasts during pumping to soften.      Maternal Data    Feeding Feeding Type: Breast Fed Length of feed: 0 min(few sucks)  LATCH Score Latch: Repeated attempts needed to sustain latch, nipple held in mouth throughout feeding, stimulation needed to elicit sucking reflex.  Audible Swallowing: A few with stimulation  Type of Nipple: Everted at rest and after stimulation  Comfort (Breast/Nipple): Soft / non-tender  Hold (Positioning): Assistance needed to correctly position infant at breast and maintain latch.  LATCH Score: 7  Interventions Interventions: Breast feeding basics reviewed;Assisted with latch;DEBP  Lactation Tools Discussed/Used     Consult Status Consult  Status: Complete Date: 01/01/17 Follow-up type: In-patient    [name], Azrielle Springsteen [facility] 12/31/2016, 11:22 AM

## 2017-01-03 NOTE — Addendum Note (Signed)
Addendum  created 01/03/17 1929 by Bethena Midgetddono, Josep Luviano, MD   Intraprocedure Staff edited

## 2017-04-14 ENCOUNTER — Ambulatory Visit: Payer: BC Managed Care – PPO | Admitting: Family Medicine

## 2017-07-14 ENCOUNTER — Encounter: Payer: Self-pay | Admitting: Family Medicine

## 2017-07-14 ENCOUNTER — Encounter

## 2017-07-14 ENCOUNTER — Ambulatory Visit: Payer: BC Managed Care – PPO | Admitting: Family Medicine

## 2017-07-14 ENCOUNTER — Telehealth: Payer: Self-pay | Admitting: Family Medicine

## 2017-07-14 VITALS — BP 96/56 | HR 58 | Temp 98.3°F | Ht 66.5 in | Wt 131.8 lb

## 2017-07-14 DIAGNOSIS — R5383 Other fatigue: Secondary | ICD-10-CM | POA: Diagnosis not present

## 2017-07-14 DIAGNOSIS — Z0000 Encounter for general adult medical examination without abnormal findings: Secondary | ICD-10-CM

## 2017-07-14 DIAGNOSIS — Z13220 Encounter for screening for lipoid disorders: Secondary | ICD-10-CM | POA: Diagnosis not present

## 2017-07-14 DIAGNOSIS — B078 Other viral warts: Secondary | ICD-10-CM | POA: Diagnosis not present

## 2017-07-14 DIAGNOSIS — B079 Viral wart, unspecified: Secondary | ICD-10-CM | POA: Insufficient documentation

## 2017-07-14 LAB — COMPREHENSIVE METABOLIC PANEL
ALT: 28 U/L (ref 0–35)
AST: 26 U/L (ref 0–37)
Albumin: 4.3 g/dL (ref 3.5–5.2)
Alkaline Phosphatase: 69 U/L (ref 39–117)
BUN: 17 mg/dL (ref 6–23)
CO2: 28 mEq/L (ref 19–32)
Calcium: 9.4 mg/dL (ref 8.4–10.5)
Chloride: 101 mEq/L (ref 96–112)
Creatinine, Ser: 0.78 mg/dL (ref 0.40–1.20)
GFR: 87.91 mL/min (ref >60.00)
Glucose, Bld: 85 mg/dL (ref 70–99)
Potassium: 4.3 mEq/L (ref 3.5–5.1)
Sodium: 136 mEq/L (ref 135–145)
Total Bilirubin: 1 mg/dL (ref 0.2–1.2)
Total Protein: 6.6 g/dL (ref 6.0–8.3)

## 2017-07-14 LAB — CBC
HCT: 41.9 % (ref 36.0–46.0)
Hemoglobin: 14.2 g/dL (ref 12.0–15.0)
MCHC: 34 g/dL (ref 30.0–36.0)
MCV: 91 fl (ref 78.0–100.0)
Platelets: 190 10*3/uL (ref 150.0–400.0)
RBC: 4.61 Mil/uL (ref 3.87–5.11)
RDW: 12.6 % (ref 11.5–15.5)
WBC: 5.2 10*3/uL (ref 4.0–10.5)

## 2017-07-14 LAB — LIPID PANEL
Cholesterol: 167 mg/dL (ref 0–200)
HDL: 53.2 mg/dL (ref >39.00)
LDL Cholesterol: 101 mg/dL — ABNORMAL HIGH (ref 0–99)
NonHDL: 114.22
Total CHOL/HDL Ratio: 3
Triglycerides: 66 mg/dL (ref 0.0–149.0)
VLDL: 13.2 mg/dL (ref 0.0–40.0)

## 2017-07-14 LAB — TSH: TSH: 2.2 u[IU]/mL (ref 0.35–4.50)

## 2017-07-14 LAB — FERRITIN: Ferritin: 32.5 ng/mL (ref 10.0–291.0)

## 2017-07-14 LAB — T4, FREE: Free T4: 0.78 ng/dL (ref 0.60–1.60)

## 2017-07-14 LAB — VITAMIN D 25 HYDROXY (VIT D DEFICIENCY, FRACTURES): VITD: 43.09 ng/mL (ref 30.00–100.00)

## 2017-07-14 NOTE — Telephone Encounter (Signed)
Can we please help the pt check on this.

## 2017-07-14 NOTE — Assessment & Plan Note (Signed)
Exam reassuring. Labs today. Orders Placed This Encounter  Procedures  . T4, free  . TSH  . CBC  . Ferritin  . Vitamin D (25 hydroxy)  . Comprehensive metabolic panel  . Lipid panel  . Ambulatory referral to Dermatology

## 2017-07-14 NOTE — Telephone Encounter (Signed)
See message below in reference to this referral

## 2017-07-14 NOTE — Patient Instructions (Signed)
Great to meet you. I will call you with your lab results from today and you can view them online.   We will call you with a dermatology referral as well.

## 2017-07-14 NOTE — Telephone Encounter (Signed)
Can we please check with another location to see if we can get her in sooner.   Thank you.

## 2017-07-14 NOTE — Assessment & Plan Note (Signed)
Reviewed preventive care protocols, scheduled due services, and updated immunizations Discussed nutrition, exercise, diet, and healthy lifestyle.  

## 2017-07-14 NOTE — Progress Notes (Signed)
Subjective:   Patient ID: Mackenzie LeschJennifer I Delgado, female    DOB: [birth year], 37 y.o.   MRN: [id number]  Mackenzie Delgado is a pleasant 37 y.o. year old female who presents to clinic today with New Patient (Initial Visit) (Patient is here today to establish care.  She is currently fasting.  Last PAP 1-year-ago when last pregnanat and would like for that to be taken over here now.  It was WNL.  She just stopped breast feeding 1-week-ago. )  on 07/14/2017  HPI:  [id number]- has a 38 year old and 646 month old. Doing well.  Quit breast feeding a few days ago.  She is a runner- runs 6- 10 miles at least 5 days a week.  Has been more tired lately.  Would like lab work done.  Wart on her right pinky- was told she couldn't treat it beyond cryotherapy (which was ineffective) until after she stopped breast feeding.    Current Outpatient Medications on File Prior to Visit  Medication Sig Dispense Refill  . Prenatal MV-Min-FA-Omega-3 (PRENATAL GUMMIES/DHA & FA PO) Take 2 capsules by mouth daily.     No current facility-administered medications on file prior to visit.     No Known Allergies  Past Medical History:  Diagnosis Date  . History of anorexia nervosa   . History of chicken pox   . PONV (postoperative nausea and vomiting)     Past Surgical History:  Procedure Laterality Date  . AURAL ATRESIA REPAIR Bilateral 1989   Multiple middle and outer ear surgeries/bilateral/30-years-ago  . DILATION AND CURETTAGE OF UTERUS    . OTHER SURGICAL HISTORY     multiple ear surgeries as a child    Family History  Problem Relation Age of Onset  . Hypertension Mother   . Cancer Mother        breast  . Arthritis Mother   . Miscarriages / [place] Mother   . Cancer Father        skin  . Asthma Father   . Hyperlipidemia Father     Social History   Socioeconomic History  . Marital status: Married    Spouse name: Not on file  . Number of children: Not on file  . Years of education: Not on file    . Highest education level: Not on file  Occupational History  . Not on file  Social Needs  . Financial resource strain: Not on file  . Food insecurity:    Worry: Not on file    Inability: Not on file  . Transportation needs:    Medical: Not on file    Non-medical: Not on file  Tobacco Use  . Smoking status: Never Smoker  . Smokeless tobacco: Never Used  Substance and Sexual Activity  . Alcohol use: Yes    Comment: not with preg  . Drug use: No  . Sexual activity: Yes    Birth control/protection: None    Comment: Husband-Vasectomy  Lifestyle  . Physical activity:    Days per week: Not on file    Minutes per session: Not on file  . Stress: Not on file  Relationships  . Social connections:    Talks on phone: Not on file    Gets together: Not on file    Attends religious service: Not on file    Active member of club or organization: Not on file    Attends meetings of clubs or organizations: Not on file    Relationship status: Not  on file  . Intimate partner violence:    Fear of current or ex partner: Not on file    Emotionally abused: Not on file    Physically abused: Not on file    Forced sexual activity: Not on file  Other Topics Concern  . Not on file  Social History Narrative  . Not on file   The PMH, PSH, Social History, Family History, Medications, and allergies have been reviewed in [facility], and have been updated if relevant.  Review of Systems  Constitutional: Negative.   HENT: Negative.   Respiratory: Negative.   Cardiovascular: Negative.   Gastrointestinal: Negative.   Endocrine: Negative.   Genitourinary: Negative.   Musculoskeletal: Negative.   Neurological: Negative.   Hematological: Negative.   Psychiatric/Behavioral: Negative.   All other systems reviewed and are negative.      Objective:    BP (!) 96/56 (BP Location: Left Arm, Patient Position: Sitting, Cuff Size: Normal)   Pulse (!) 58   Temp 98.3 F (36.8 C) (Oral)   Ht 5' 6.5" (1.689 m)    Wt 131 lb 12.8 oz (59.8 kg)   LMP 06/30/2017   SpO2 97%   Breastfeeding? No Comment: Stopped 1-week-ago  BMI 20.95 kg/m    Physical Exam   General:  Well-developed,well-nourished,in no acute distress; alert,appropriate and cooperative throughout examination Head:  normocephalic and atraumatic.   Eyes:  vision grossly intact, PERRL Ears:  R ear normal and L ear normal externally, TMs clear bilaterally Nose:  no external deformity.   Mouth:  good dentition.   Neck:  No deformities, masses, or tenderness noted. Breasts:  No mass, nodules, thickening, tenderness, bulging, retraction, inflamation, nipple discharge or skin changes noted.   Lungs:  Normal respiratory effort, chest expands symmetrically. Lungs are clear to auscultation, no crackles or wheezes. Heart:  Normal rate and regular rhythm. S1 and S2 normal without gallop, murmur, click, rub or other extra sounds. Abdomen:  Bowel sounds positive,abdomen soft and non-tender without masses, organomegaly or hernias noted. Msk:  No deformity or scoliosis noted of thoracic or lumbar spine.   Extremities:  No clubbing, cyanosis, edema, or deformity noted with normal full range of motion of all joints.   Neurologic:  alert & oriented X3 and gait normal.   Skin:  Intact without suspicious lesions or rashes + large periungal wart left 5th digit Cervical Nodes:  No lymphadenopathy noted Axillary Nodes:  No palpable lymphadenopathy Psych:  Cognition and judgment appear intact. Alert and cooperative with normal attention span and concentration. No apparent delusions, illusions, hallucinations       Assessment & Plan:   Screening, lipid - Plan: Lipid panel  Well woman exam (no gynecological exam)  Other fatigue - Plan: T4, free, TSH, CBC, Ferritin, Vitamin D (25 hydroxy), Comprehensive metabolic panel  Periungual wart - Plan: Ambulatory referral to Dermatology No follow-ups on file.

## 2017-07-14 NOTE — Assessment & Plan Note (Signed)
Failed cryotherapy.  Refer to derm- ? Location and duration- may require laser removal.

## 2017-07-14 NOTE — Telephone Encounter (Signed)
Hello Ladies, [facility].  I spoke with a Bratillia at the office and she states that the referral is on someone desk, and if the referral is not urgent for her to be seen sooner than it will have to stay as a standing order, also they are scheduling np out to October.  Please let me know if I need to find another location.

## 2017-07-19 ENCOUNTER — Encounter: Payer: Self-pay | Admitting: Family Medicine

## 2017-11-03 ENCOUNTER — Encounter: Payer: Self-pay | Admitting: Family Medicine

## 2017-12-21 ENCOUNTER — Ambulatory Visit: Payer: Self-pay | Admitting: *Deleted

## 2017-12-21 NOTE — Telephone Encounter (Signed)
Pt reports increased fatigue, decreased endurance, decreased libido x 2 months. States is able to work and attend to daily ADLs, take care of children. Denies SOB, dizziness. States no change in appetite, is staying hydrated. No new medications or med changes; has started taking a multi-vitamin. States is seeing a Information systems manager"counselor" who suggested she see PCP for possible lab work, evaluation. Appt made with Dr. Clifton CustardAaron for Nov.19, 2019 Reason for Disposition . Fatigue is a chronic symptom (recurrent or ongoing AND present > 4 weeks)  Answer Assessment - Initial Assessment Questions 1. DESCRIPTION: "Describe how you are feeling."     Increased fatigue, poor endurance, decreased libido 2. SEVERITY: "How bad is it?"  "Can you stand and walk?"   - MILD - Feels weak or tired, but does not interfere with work, school or normal activities   - MODERATE - Able to stand and walk; weakness interferes with work, school, or normal activities   - SEVERE - Unable to stand or walk     mild 3. ONSET:  "When did the weakness begin?"     2 months ago 4. CAUSE: "What do you think is causing the weakness?"     unsure 5. MEDICINES: "Have you recently started a new medicine or had a change in the amount of a medicine?"     no 6. OTHER SYMPTOMS: "Do you have any other symptoms?" (e.g., chest pain, fever, cough, SOB, vomiting, diarrhea, bleeding, other areas of pain)     Decreased libido 7. PREGNANCY: "Is there any chance you are pregnant?" "When was your last menstrual period?"     no  Protocols used: WEAKNESS (GENERALIZED) AND FATIGUE-A-AH

## 2017-12-27 ENCOUNTER — Ambulatory Visit: Payer: BC Managed Care – PPO | Admitting: Family Medicine

## 2017-12-27 NOTE — Progress Notes (Signed)
Subjective:   Patient ID: Mackenzie Delgado, female    DOB: 12-15-1980, 37 y.o.   MRN: [id number]  Mackenzie Delgado is a pleasant 37 y.o. year old female who presents to clinic today with Fatigue (Patient is here today C/O increased fatigue.  Also states has had decreased endurance and libido.  All of this started 84-months-ago except decreased libido has been going on for 6 months.  She only take a Multivitamin. Denies any major life changes. She is currently fasting.)  on 12/29/2017  HPI:  I last saw patient when she established care with me on 07/14/17.  Note reviewed. At that OV, she had just stopped breast feeding the week prior. Avid runner- runs 6-10 miles at least 5 days per week.  We did labs after that OV on 07/14/17 including ferritin, Vitamin D, TSH, CBC, CMET and cholesterol.    All were within normal limits.  Lab Results  Component Value Date   TSH 2.20 07/14/2017   Lab Results  Component Value Date   WBC 5.2 07/14/2017   HGB 14.2 07/14/2017   HCT 41.9 07/14/2017   MCV 91.0 07/14/2017   PLT 190.0 07/14/2017   She is here today for increasing fatigued and decreased exercise endurance.    Call [facility] on 12/21/17 and told the triage nurse the following:  Pt reports increased fatigue, decreased endurance, decreased libido x 2 months. States is able to work and attend to daily ADLs, take care of children. Denies SOB, dizziness. States no change in appetite, is staying hydrated. No new medications or med changes; has started taking a multi-vitamin. States is seeing a Information systems manager" who suggested she see PCP for possible lab work, evaluation.  Depression screen [facility] 2/9 12/29/2017 07/14/2017  Decreased Interest 0 0  Down, Depressed, Hopeless 1 0  PHQ - 2 Score 1 0  Altered sleeping 0 -  Tired, decreased energy 2 -  Change in appetite 0 -  Feeling bad or failure about yourself  0 -  Trouble concentrating 0 -  Moving slowly or fidgety/restless 0 -  Suicidal thoughts 0 -    PHQ-9 Score 3 -   She denies feeling depressed or anxious.  She is most concerned about her complete lack of libido.  Sex is not painful, she can orgasm but often does not want to put in the effort to d oso.  She does not have desire to have sex or to masturbate and has not for the past 6 months.  Initially she thought it was because she was breast feeding and tired but she is no longer breast feeling and her kids are sleeping well.  She feels her relationship with her husband is good and is not the issue in her decreased libido.  She is seeing a therapist weekly who advised her to come see me for this since she does not feel that [name] is depressed or anxious.  Fatigue and decreased exercise endurance- denies CP, SOB or dizziness.  Sleeping well.  Just has less energy.     Current Outpatient Medications on File Prior to Visit  Medication Sig Dispense Refill  . Multiple Vitamin (MULTIVITAMIN) tablet Take 1 tablet by mouth daily.     No current facility-administered medications on file prior to visit.     No Known Allergies  Past Medical History:  Diagnosis Date  . History of anorexia nervosa   . History of chicken pox   . PONV (postoperative nausea and vomiting)  Past Surgical History:  Procedure Laterality Date  . AURAL ATRESIA REPAIR Bilateral 1989   Multiple middle and outer ear surgeries/bilateral/30-years-ago  . DILATION AND CURETTAGE OF UTERUS    . OTHER SURGICAL HISTORY     multiple ear surgeries as a child    Family History  Problem Relation Age of Onset  . Hypertension Mother   . Cancer Mother        breast  . Arthritis Mother   . Miscarriages / [place]   . Cancer Father        skin  . Asthma Father   . Hyperlipidemia Father     Social History   Socioeconomic History  . Marital status: Married    Spouse name: Not on file  . Number of children: Not on file  . Years of education: Not on file  . Highest education level: Not on file   Occupational History  . Not on file  Social Needs  . Financial resource strain: Not on file  . Food insecurity:    Worry: Not on file    Inability: Not on file  . Transportation needs:    Medical: Not on file    Non-medical: Not on file  Tobacco Use  . Smoking status: Never Smoker  . Smokeless tobacco: Never Used  Substance and Sexual Activity  . Alcohol use: Yes    Comment: not with preg  . Drug use: No  . Sexual activity: Yes    Birth control/protection: None    Comment: Husband-Vasectomy  Lifestyle  . Physical activity:    Days per week: Not on file    Minutes per session: Not on file  . Stress: Not on file  Relationships  . Social connections:    Talks on phone: Not on file    Gets together: Not on file    Attends religious service: Not on file    Active member of club or organization: Not on file    Attends meetings of clubs or organizations: Not on file    Relationship status: Not on file  . Intimate partner violence:    Fear of current or ex partner: Not on file    Emotionally abused: Not on file    Physically abused: Not on file    Forced sexual activity: Not on file  Other Topics Concern  . Not on file  Social History Narrative  . Not on file   The PMH, PSH, Social History, Family History, Medications, and allergies [facility], and have been updated if relevant.         Review of Systems  Constitutional: Positive for fatigue.  HENT: Negative.   Eyes: Negative.   Respiratory: Negative.   Cardiovascular: Negative.   Gastrointestinal: Negative.   Endocrine: Negative.   Genitourinary: Negative for pelvic pain and vaginal pain.  Musculoskeletal: Negative.   Skin: Negative.   Allergic/Immunologic: Negative.   Neurological: Negative.   Hematological: Negative.   Psychiatric/Behavioral: Negative.   All other systems reviewed and are negative.      Objective:    BP 100/66 (BP Location: Left Arm, Patient Position: Sitting, Cuff  Size: Normal)   Pulse 64   Temp (!) 97.4 F (36.3 C) (Oral)   Ht 5' 6.5" (1.689 m)   Wt 130 lb 9.6 oz (59.2 kg)   LMP 12/22/2017   SpO2 98%   Breastfeeding? No   BMI 20.76 kg/m    Physical Exam   General:  Well-developed,well-nourished,in no  acute distress; alert,appropriate and cooperative throughout examination Head:  normocephalic and atraumatic.   Eyes:  vision grossly intact, PERRL Ears:  R ear normal and L ear normal externally, TMs clear bilaterally Nose:  no external deformity.   Mouth:  good dentition.   Neck:  No deformities, masses, or tenderness noted.  Lungs:  Normal respiratory effort, chest expands symmetrically. Lungs are clear to auscultation, no crackles or wheezes. Heart:  Normal rate and regular rhythm. S1 and S2 normal without gallop, murmur, click, rub or other extra sounds. Abdomen:  Bowel sounds positive,abdomen soft and non-tender without masses, organomegaly or hernias noted. Msk:  No deformity or scoliosis noted of thoracic or lumbar spine.   Extremities:  No clubbing, cyanosis, edema, or deformity noted with normal full range of motion of all joints.   Neurologic:  alert & oriented X3 and gait normal.   Skin:  Intact without suspicious lesions or rashes Psych:  Cognition and judgment appear intact. Alert and cooperative with normal attention span and concentration. No apparent delusions, illusions, hallucinations       Assessment & Plan:   Other fatigue - Plan: TSH, LH, FSH, CBC with Differential/Platelet, Comprehensive metabolic panel, B12, Vitamin D (25 hydroxy), Ferritin, Prolactin  Decreased sexual desire - Plan: LH, FSH, CBC with Differential/Platelet  Decreased libido No follow-ups on file.

## 2017-12-28 ENCOUNTER — Other Ambulatory Visit: Payer: Self-pay

## 2017-12-29 ENCOUNTER — Encounter: Payer: Self-pay | Admitting: Family Medicine

## 2017-12-29 ENCOUNTER — Ambulatory Visit: Payer: BC Managed Care – PPO | Admitting: Family Medicine

## 2017-12-29 VITALS — BP 100/66 | HR 64 | Temp 97.4°F | Ht 66.5 in | Wt 130.6 lb

## 2017-12-29 DIAGNOSIS — R6882 Decreased libido: Secondary | ICD-10-CM | POA: Insufficient documentation

## 2017-12-29 DIAGNOSIS — R5383 Other fatigue: Secondary | ICD-10-CM | POA: Diagnosis not present

## 2017-12-29 DIAGNOSIS — F520 Hypoactive sexual desire disorder: Secondary | ICD-10-CM | POA: Insufficient documentation

## 2017-12-29 LAB — VITAMIN B12: Vitamin B-12: 733 pg/mL (ref 211–911)

## 2017-12-29 LAB — COMPREHENSIVE METABOLIC PANEL
ALT: 27 U/L (ref 0–35)
AST: 26 U/L (ref 0–37)
Albumin: 4.5 g/dL (ref 3.5–5.2)
Alkaline Phosphatase: 55 U/L (ref 39–117)
BUN: 24 mg/dL — ABNORMAL HIGH (ref 6–23)
CO2: 27 mEq/L (ref 19–32)
Calcium: 9.6 mg/dL (ref 8.4–10.5)
Chloride: 104 mEq/L (ref 96–112)
Creatinine, Ser: 0.86 mg/dL (ref 0.40–1.20)
GFR: 78.35 mL/min (ref >60.00)
Glucose, Bld: 85 mg/dL (ref 70–99)
Potassium: 4.3 mEq/L (ref 3.5–5.1)
Sodium: 139 mEq/L (ref 135–145)
Total Bilirubin: 0.6 mg/dL (ref 0.2–1.2)
Total Protein: 6.8 g/dL (ref 6.0–8.3)

## 2017-12-29 LAB — CBC WITH DIFFERENTIAL/PLATELET
Basophils Absolute: 0 10*3/uL (ref 0.0–0.1)
Basophils Relative: 0.3 % (ref 0.0–3.0)
Eosinophils Absolute: 0.1 10*3/uL (ref 0.0–0.7)
Eosinophils Relative: 1.8 % (ref 0.0–5.0)
HCT: 42.6 % (ref 36.0–46.0)
Hemoglobin: 14.3 g/dL (ref 12.0–15.0)
Lymphocytes Relative: 28.3 % (ref 12.0–46.0)
Lymphs Abs: 1.9 10*3/uL (ref 0.7–4.0)
MCHC: 33.6 g/dL (ref 30.0–36.0)
MCV: 92.1 fl (ref 78.0–100.0)
Monocytes Absolute: 0.3 10*3/uL (ref 0.1–1.0)
Monocytes Relative: 5 % (ref 3.0–12.0)
Neutro Abs: 4.3 10*3/uL (ref 1.4–7.7)
Neutrophils Relative %: 64.6 % (ref 43.0–77.0)
Platelets: 213 10*3/uL (ref 150.0–400.0)
RBC: 4.63 Mil/uL (ref 3.87–5.11)
RDW: 12.7 % (ref 11.5–15.5)
WBC: 6.7 10*3/uL (ref 4.0–10.5)

## 2017-12-29 LAB — FOLLICLE STIMULATING HORMONE: FSH: 9.1 m[IU]/mL

## 2017-12-29 LAB — TSH: TSH: 3.09 u[IU]/mL (ref 0.35–4.50)

## 2017-12-29 LAB — FERRITIN: Ferritin: 30.5 ng/mL (ref 10.0–291.0)

## 2017-12-29 LAB — LUTEINIZING HORMONE: LH: 2.85 m[IU]/mL

## 2017-12-29 LAB — VITAMIN D 25 HYDROXY (VIT D DEFICIENCY, FRACTURES): VITD: 38.38 ng/mL (ref 30.00–100.00)

## 2017-12-29 NOTE — Patient Instructions (Addendum)
Great to see you. I will call you with your lab results from today and you can view them online.   The medication is Addyi- look it up and tell me your thoughts.    .Marland Kitchen

## 2017-12-29 NOTE — Assessment & Plan Note (Signed)
New- >25 minutes spent in face to face time with patient, >50% spent in counselling or coordination of care discussed decreased sexual desire and fatigue.  Complicated issue and often is multifactorial.  PHQ 9 and GAD 7 screens neg.  She is seeing a therapist weekly and feels her relationship with her husband is good.  She has not interest in self pleasure either, so I agree this could be an organic issue.  Will check labs today as initial part of work, continue psychotherapy. We did discuss Addyi.  She will research this and we will discuss more after we have her lab results. The patient indicates understanding of these issues and agrees with the plan  Orders Placed This Encounter  Procedures  . TSH  . LH  . FSH  . CBC with Differential/Platelet  . Comprehensive metabolic panel  . B12  . Vitamin D (25 hydroxy)  . Ferritin  . Prolactin   .

## 2017-12-30 LAB — PROLACTIN: Prolactin: 6.4 ng/mL

## 2018-01-02 ENCOUNTER — Encounter: Payer: Self-pay | Admitting: Family Medicine

## 2018-02-27 ENCOUNTER — Encounter: Payer: Self-pay | Admitting: Family Medicine

## 2018-02-27 ENCOUNTER — Ambulatory Visit: Payer: BC Managed Care – PPO | Admitting: Family Medicine

## 2018-02-27 VITALS — BP 100/70 | HR 66 | Temp 97.6°F | Ht 66.5 in | Wt 130.0 lb

## 2018-02-27 DIAGNOSIS — J069 Acute upper respiratory infection, unspecified: Secondary | ICD-10-CM | POA: Diagnosis not present

## 2018-02-27 DIAGNOSIS — B9789 Other viral agents as the cause of diseases classified elsewhere: Secondary | ICD-10-CM | POA: Diagnosis not present

## 2018-02-27 MED ORDER — HYDROCOD POLST-CPM POLST ER 10-8 MG/5ML PO SUER: 5.0000 mL | Freq: Every evening | ORAL | 0 refills | Status: DC | PRN

## 2018-02-27 NOTE — Progress Notes (Signed)
Mackenzie Delgado is a 38 y.o. female  Chief Complaint  Patient presents with  . Cough    congested, fatigued, non productive cough/ started 3 days/OTC sudafed, musinex DM    HPI: Mackenzie LeschJennifer I Dommer is a 38 y.o. female who is a patient of Dr. Dayton MartesAron and today complains of 3 day h/o non-productive cough that is keeping her up at night. She also notes nasal congestion, fatigue.  No fever, sore throat, headache, ear pain. No body aches.  Pt has tried taking ibuprofen and sudafed as well as mucinex DM.  + sick contact - husband and 1yo daughter.   Past Medical History:  Diagnosis Date  . History of anorexia nervosa   . History of chicken pox   . PONV (postoperative nausea and vomiting)     Past Surgical History:  Procedure Laterality Date  . AURAL ATRESIA REPAIR Bilateral 1989   Multiple middle and outer ear surgeries/bilateral/30-years-ago  . DILATION AND CURETTAGE OF UTERUS    . OTHER SURGICAL HISTORY     multiple ear surgeries as a child    Social History   Socioeconomic History  . Marital status: Married    Spouse name: Not on file  . Number of children: Not on file  . Years of education: Not on file  . Highest education level: Not on file  Occupational History  . Not on file  Social Needs  . Financial resource strain: Not on file  . Food insecurity:    Worry: Not on file    Inability: Not on file  . Transportation needs:    Medical: Not on file    Non-medical: Not on file  Tobacco Use  . Smoking status: Never Smoker  . Smokeless tobacco: Never Used  Substance and Sexual Activity  . Alcohol use: Yes    Comment: not with preg  . Drug use: No  . Sexual activity: Yes    Birth control/protection: None    Comment: Husband-Vasectomy  Lifestyle  . Physical activity:    Days per week: Not on file    Minutes per session: Not on file  . Stress: Not on file  Relationships  . Social connections:    Talks on phone: Not on file    Gets together: Not on file   Attends religious service: Not on file    Active member of club or organization: Not on file    Attends meetings of clubs or organizations: Not on file    Relationship status: Not on file  . Intimate partner violence:    Fear of current or ex partner: Not on file    Emotionally abused: Not on file    Physically abused: Not on file    Forced sexual activity: Not on file  Other Topics Concern  . Not on file  Social History Narrative  . Not on file    Family History  Problem Relation Age of Onset  . Hypertension Mother   . Cancer Mother        breast  . Arthritis Mother   . Miscarriages / [place] Mother   . Cancer Father        skin  . Asthma Father   . Hyperlipidemia Father      Immunization History  Administered Date(s) Administered  . Influenza-Unspecified 11/17/2017  . Tdap 10/15/2016    Outpatient Encounter Medications as of 02/27/2018  Medication Sig  . Multiple Vitamin (MULTIVITAMIN) tablet Take 1 tablet by mouth daily.  . chlorpheniramine-HYDROcodone (  TUSSIONEX PENNKINETIC ER) 10-8 MG/5ML SUER Take 5 mLs by mouth at bedtime as needed for cough.   No facility-administered encounter medications on file as of 02/27/2018.      ROS: Pertinent positives and negatives noted in HPI. Remainder of ROS non-contributory    No Known Allergies  BP 100/70   Pulse 66   Temp 97.6 F (36.4 C) (Oral)   Ht 5' 6.5" (1.689 m)   Wt 130 lb (59 kg)   SpO2 99%   BMI 20.67 kg/m   Physical Exam  Constitutional: She is oriented to person, place, and time. She appears well-developed and well-nourished. No distress.  HENT:  Head: Normocephalic and atraumatic.  Right Ear: Tympanic membrane and ear canal normal.  Left Ear: Tympanic membrane and ear canal normal.  Nose: Mucosal edema present. No rhinorrhea. Right sinus exhibits no maxillary sinus tenderness and no frontal sinus tenderness. Left sinus exhibits no maxillary sinus tenderness and no frontal sinus tenderness.    Mouth/Throat: Mucous membranes are normal. Posterior oropharyngeal erythema present. No oropharyngeal exudate or posterior oropharyngeal edema.  Neck: Neck supple.  Cardiovascular: Normal rate, regular rhythm and normal heart sounds.  Pulmonary/Chest: Effort normal and breath sounds normal. No respiratory distress. She has no wheezes. She has no rhonchi.  Lymphadenopathy:    She has no cervical adenopathy.  Neurological: She is alert and oriented to person, place, and time.  Psychiatric: She has a normal mood and affect. Her behavior is normal.     A/P:  1. Viral URI with cough - cont supportive care to include increased fluids, rest, tylenol or ibuprofen PRN - nasal saline spray 3x/day, flonase daily, mucinex BID Rx: - chlorpheniramine-HYDROcodone (TUSSIONEX PENNKINETIC ER) 10-8 MG/5ML SUER; Take 5 mLs by mouth at bedtime as needed for cough.  Dispense: 140 mL; Refill: 0 - f/u if symptoms worsen or do not improve in 7-10 days Discussed plan and reviewed medications with patient, including risks, benefits, and potential side effects. Pt expressed understand. All questions answered.

## 2018-02-27 NOTE — Patient Instructions (Addendum)
Drink plenty of fluids, especially water Rest Use nasal saline spray at least 3 times per day Use humidifier at night Try Mucinex DM 1 tab twice per day Try flonase 2 sprays each nostril daily Take cough syrup as needed at bedtime  Follow-up if symptoms worsen or do not improve in 7-10 days

## 2018-03-02 ENCOUNTER — Encounter: Payer: Self-pay | Admitting: Family Medicine

## 2018-03-02 DIAGNOSIS — B9789 Other viral agents as the cause of diseases classified elsewhere: Principal | ICD-10-CM

## 2018-03-02 DIAGNOSIS — J069 Acute upper respiratory infection, unspecified: Secondary | ICD-10-CM

## 2018-03-03 ENCOUNTER — Encounter: Payer: Self-pay | Admitting: Family Medicine

## 2018-03-03 MED ORDER — AZITHROMYCIN 250 MG PO TABS: 250.0000 mg | ORAL_TABLET | Freq: Every day | ORAL | 0 refills | Status: DC

## 2018-05-01 ENCOUNTER — Encounter: Payer: Self-pay | Admitting: Family Medicine

## 2018-10-27 NOTE — Progress Notes (Signed)
Subjective:   Patient ID: Mackenzie Delgado, female    DOB: 1980-06-19, 38 y.o.   MRN: [id number]  Mackenzie Delgado is a pleasant 37 y.o. year old female who presents to clinic today with Annual Exam (Pt is here today for a CPE.  She would like to get a flu shot today. Next PAP due in 2021. She is not fasting. )  on 10/30/2018  HPI:  [id number]- has a 39 year old and and an 57 month old. Doing well.     Has GYN-  [facility], next pap is due in 2021.  Depression screen [facility] 2/9 10/30/2018 12/29/2017 07/14/2017  Decreased Interest 0 0 0  Down, Depressed, Hopeless 0 1 0  PHQ - 2 Score 0 1 0  Altered sleeping - 0 -  Tired, decreased energy - 2 -  Change in appetite - 0 -  Feeling bad or failure about yourself  - 0 -  Trouble concentrating - 0 -  Moving slowly or fidgety/restless - 0 -  Suicidal thoughts - 0 -  PHQ-9 Score - 3 -   GAD 7 : Generalized Anxiety Score 12/29/2017  Nervous, Anxious, on Edge 0  Control/stop worrying 1  Worry too much - different things 1  Trouble relaxing 0  Restless 0  Easily annoyed or irritable 0  Afraid - awful might happen 0  Total GAD 7 Score 2      Health Maintenance  Topic Date Due  . INFLUENZA VACCINE  09/09/2018  . PAP SMEAR-Modifier  07/15/2019  . TETANUS/TDAP  10/16/2026  . HIV Screening  Completed   .  She is a runner- runs 6- 10 miles at least 5 days a week.    Has had some dizziness every morning for a week when she first gets out of bed in the morning.   Goes away after she sits up for a few minutes. AURAL ATRESIA REPAIR Bilateral 1989  Multiple middle and outer ear surgeries/bilateral/30-years-ago     Lab Results  Component Value Date   CHOL 167 07/14/2017   HDL 53.20 07/14/2017   LDLCALC 101 (H) 07/14/2017   TRIG 66.0 07/14/2017   CHOLHDL 3 07/14/2017   Lab Results  Component Value Date   NA 139 12/29/2017   K 4.3 12/29/2017   CL 104 12/29/2017   CO2 27 12/29/2017   Lab Results  Component Value Date   CREATININE 0.86 12/29/2017   Lab Results  Component Value Date   TSH 3.09 12/29/2017   Lab Results  Component Value Date   WBC 6.7 12/29/2017   HGB 14.3 12/29/2017   HCT 42.6 12/29/2017   MCV 92.1 12/29/2017   PLT 213.0 12/29/2017   Lab Results  Component Value Date   FERRITIN 30.5 12/29/2017   Lab Results  Component Value Date   VITAMINB12 733 12/29/2017     Current Outpatient Medications on File Prior to Visit  Medication Sig Dispense Refill  . Multiple Vitamin (MULTIVITAMIN) tablet Take 1 tablet by mouth daily.     No current facility-administered medications on file prior to visit.     No Known Allergies  Past Medical History:  Diagnosis Date  . History of anorexia nervosa   . History of chicken pox   . PONV (postoperative nausea and vomiting)     Past Surgical History:  Procedure Laterality Date  . AURAL ATRESIA REPAIR Bilateral 1989   Multiple middle and outer ear surgeries/bilateral/30-years-ago  . DILATION AND CURETTAGE OF UTERUS    .  OTHER SURGICAL HISTORY     multiple ear surgeries as a child    Family History  Problem Relation Age of Onset  . Hypertension Mother   . Cancer Mother        breast  . Arthritis Mother   . Miscarriages / [place] Mother   . Cancer Father        skin  . Asthma Father   . Hyperlipidemia Father     Social History   Socioeconomic History  . Marital status: Married    Spouse name: Not on file  . Number of children: Not on file  . Years of education: Not on file  . Highest education level: Not on file  Occupational History  . Not on file  Social Needs  . Financial resource strain: Not on file  . Food insecurity    Worry: Not on file    Inability: Not on file  . Transportation needs    Medical: Not on file    Non-medical: Not on file  Tobacco Use  . Smoking status: Never Smoker  . Smokeless tobacco: Never Used  Substance and Sexual Activity  . Alcohol use: Yes    Comment: not with preg  . Drug  use: No  . Sexual activity: Yes    Birth control/protection: None    Comment: Husband-Vasectomy  Lifestyle  . Physical activity    Days per week: Not on file    Minutes per session: Not on file  . Stress: Not on file  Relationships  . Social Musician on phone: Not on file    Gets together: Not on file    Attends religious service: Not on file    Active member of club or organization: Not on file    Attends meetings of clubs or organizations: Not on file    Relationship status: Not on file  . Intimate partner violence    Fear of current or ex partner: Not on file    Emotionally abused: Not on file    Physically abused: Not on file    Forced sexual activity: Not on file  Other Topics Concern  . Not on file  Social History Narrative  . Not on file   The PMH, PSH, Social History, Family History, Medications, and allergies have been reviewed in [facility], and have been updated if relevant.  Review of Systems  Constitutional: Negative.   HENT: Negative.   Respiratory: Negative.   Cardiovascular: Negative.   Gastrointestinal: Negative.   Endocrine: Negative.   Genitourinary: Negative.   Musculoskeletal: Negative.   Neurological: Positive for dizziness. Negative for tremors, seizures, syncope, facial asymmetry, speech difficulty, weakness, light-headedness, numbness and headaches.  Hematological: Negative.   Psychiatric/Behavioral: Negative.   All other systems reviewed and are negative.      Objective:    BP 114/70 (BP Location: Left Arm, Patient Position: Sitting, Cuff Size: Normal)   Pulse 71   Temp 97.6 F (36.4 C) (Oral)   Ht 5' 6.5" (1.689 m)   Wt 131 lb 6.4 oz (59.6 kg)   LMP 10/23/2018   SpO2 98%   BMI 20.89 kg/m   Wt Readings from Last 3 Encounters:  10/30/18 131 lb 6.4 oz (59.6 kg)  02/27/18 130 lb (59 kg)  12/29/17 130 lb 9.6 oz (59.2 kg)    Physical Exam   General:  Well-developed,well-nourished,in no acute distress; alert,appropriate and  cooperative throughout examination Head:  normocephalic and atraumatic.   Eyes:  Bilateral horizontal  nystagmus with dix hallpike Ears: right cerumen impaction Nose:  no external deformity.   Mouth:  good dentition.   Neck:  No deformities, masses, or tenderness noted. Breasts:  No mass, nodules, thickening, tenderness, bulging, retraction, inflamation, nipple discharge or skin changes noted.   Lungs:  Normal respiratory effort, chest expands symmetrically. Lungs are clear to auscultation, no crackles or wheezes. Heart:  Normal rate and regular rhythm. S1 and S2 normal without gallop, murmur, click, rub or other extra sounds. Abdomen:  Bowel sounds positive,abdomen soft and non-tender without masses, organomegaly or hernias noted. Msk:  No deformity or scoliosis noted of thoracic or lumbar spine.   Extremities:  No clubbing, cyanosis, edema, or deformity noted with normal full range of motion of all joints.   Neurologic:  alert & oriented X3 and gait normal.   Skin:  Intact without suspicious lesions or rashes Cervical Nodes:  No lymphadenopathy noted Axillary Nodes:  No palpable lymphadenopathy Psych:  Cognition and judgment appear intact. Alert and cooperative with normal attention span and concentration. No apparent delusions, illusions, hallucinations       Assessment & Plan:   Other fatigue - Plan: CBC, Ferritin, Vitamin D (25 hydroxy), B12  Hyperlipidemia, unspecified hyperlipidemia type - Plan: Lipid panel, CBC, Comprehensive metabolic panel, TSH  Need for influenza vaccination - Plan: Flu Vaccine QUAD 6+ mos PF IM (Fluarix Quad PF)  Well woman exam without gynecological exam  BPV (benign positional vertigo), right  Impacted cerumen of right ear No follow-ups on file.

## 2018-10-30 ENCOUNTER — Ambulatory Visit (INDEPENDENT_AMBULATORY_CARE_PROVIDER_SITE_OTHER): Payer: BC Managed Care – PPO | Admitting: Family Medicine

## 2018-10-30 ENCOUNTER — Encounter: Payer: Self-pay | Admitting: Family Medicine

## 2018-10-30 ENCOUNTER — Other Ambulatory Visit: Payer: Self-pay

## 2018-10-30 VITALS — BP 114/70 | HR 71 | Temp 97.6°F | Ht 66.5 in | Wt 131.4 lb

## 2018-10-30 DIAGNOSIS — R5383 Other fatigue: Secondary | ICD-10-CM | POA: Diagnosis not present

## 2018-10-30 DIAGNOSIS — H8111 Benign paroxysmal vertigo, right ear: Secondary | ICD-10-CM | POA: Insufficient documentation

## 2018-10-30 DIAGNOSIS — H6121 Impacted cerumen, right ear: Secondary | ICD-10-CM

## 2018-10-30 DIAGNOSIS — Z0000 Encounter for general adult medical examination without abnormal findings: Secondary | ICD-10-CM

## 2018-10-30 DIAGNOSIS — Z23 Encounter for immunization: Secondary | ICD-10-CM | POA: Diagnosis not present

## 2018-10-30 DIAGNOSIS — E785 Hyperlipidemia, unspecified: Secondary | ICD-10-CM

## 2018-10-30 DIAGNOSIS — H6120 Impacted cerumen, unspecified ear: Secondary | ICD-10-CM | POA: Insufficient documentation

## 2018-10-30 LAB — CBC
HCT: 41.1 % (ref 36.0–46.0)
Hemoglobin: 13.9 g/dL (ref 12.0–15.0)
MCHC: 33.7 g/dL (ref 30.0–36.0)
MCV: 93.1 fl (ref 78.0–100.0)
Platelets: 194 10*3/uL (ref 150.0–400.0)
RBC: 4.41 Mil/uL (ref 3.87–5.11)
RDW: 12.3 % (ref 11.5–15.5)
WBC: 6.2 10*3/uL (ref 4.0–10.5)

## 2018-10-30 LAB — COMPREHENSIVE METABOLIC PANEL
ALT: 37 U/L — ABNORMAL HIGH (ref 0–35)
AST: 34 U/L (ref 0–37)
Albumin: 4.4 g/dL (ref 3.5–5.2)
Alkaline Phosphatase: 51 U/L (ref 39–117)
BUN: 16 mg/dL (ref 6–23)
CO2: 28 mEq/L (ref 19–32)
Calcium: 9.4 mg/dL (ref 8.4–10.5)
Chloride: 103 mEq/L (ref 96–112)
Creatinine, Ser: 0.72 mg/dL (ref 0.40–1.20)
GFR: 90.1 mL/min (ref >60.00)
Glucose, Bld: 85 mg/dL (ref 70–99)
Potassium: 4.1 mEq/L (ref 3.5–5.1)
Sodium: 137 mEq/L (ref 135–145)
Total Bilirubin: 0.8 mg/dL (ref 0.2–1.2)
Total Protein: 6.5 g/dL (ref 6.0–8.3)

## 2018-10-30 LAB — LIPID PANEL
Cholesterol: 152 mg/dL (ref 0–200)
HDL: 50.5 mg/dL (ref >39.00)
LDL Cholesterol: 83 mg/dL (ref 0–99)
NonHDL: 101.14
Total CHOL/HDL Ratio: 3
Triglycerides: 91 mg/dL (ref 0.0–149.0)
VLDL: 18.2 mg/dL (ref 0.0–40.0)

## 2018-10-30 LAB — TSH: TSH: 2.49 u[IU]/mL (ref 0.35–4.50)

## 2018-10-30 LAB — FERRITIN: Ferritin: 32.1 ng/mL (ref 10.0–291.0)

## 2018-10-30 LAB — VITAMIN D 25 HYDROXY (VIT D DEFICIENCY, FRACTURES): VITD: 58.71 ng/mL (ref 30.00–100.00)

## 2018-10-30 LAB — VITAMIN B12: Vitamin B-12: 621 pg/mL (ref 211–911)

## 2018-10-30 NOTE — Patient Instructions (Addendum)
How to Perform the Epley Maneuver The Epley maneuver is an exercise that relieves symptoms of vertigo. Vertigo is the feeling that you or your surroundings are moving when they are not. When you feel vertigo, you may feel like the room is spinning and have trouble walking. Dizziness is a little different than vertigo. When you are dizzy, you may feel unsteady or light-headed. You can do this maneuver at home whenever you have symptoms of vertigo. You can do it up to 3 times a day until your symptoms go away. Even though the Epley maneuver may relieve your vertigo for a few weeks, it is possible that your symptoms will return. This maneuver relieves vertigo, but it does not relieve dizziness. What are the risks? If it is done correctly, the Epley maneuver is considered safe. Sometimes it can lead to dizziness or nausea that goes away after a short time. If you develop other symptoms, such as changes in vision, weakness, or numbness, stop doing the maneuver and call your health care provider. How to perform the Epley maneuver 1. Sit on the edge of a bed or table with your back straight and your legs extended or hanging over the edge of the bed or table. 2. Turn your head halfway toward the affected ear or side. 3. Lie backward quickly with your head turned until you are lying flat on your back. You may want to position a pillow under your shoulders. 4. Hold this position for 30 seconds. You may experience an attack of vertigo. This is normal. 5. Turn your head to the opposite direction until your unaffected ear is facing the floor. 6. Hold this position for 30 seconds. You may experience an attack of vertigo. This is normal. Hold this position until the vertigo stops. 7. Turn your whole body to the same side as your head. Hold for another 30 seconds. 8. Sit back up. You can repeat this exercise up to 3 times a day. Follow these instructions at home:  After doing the Epley maneuver, you can return to  your normal activities.  Ask your health care provider if there is anything you should do at home to prevent vertigo. He or she may recommend that you: ? Keep your head raised (elevated) with two or more pillows while you sleep. ? Do not sleep on the side of your affected ear. ? Get up slowly from bed. ? Avoid sudden movements during the day. ? Avoid extreme head movement, like looking up or bending over. Contact a health care provider if:  Your vertigo gets worse.  You have other symptoms, including: ? Nausea. ? Vomiting. ? Headache. Get help right away if:  You have vision changes.  You have a severe or worsening headache or neck pain.  You cannot stop vomiting.  You have new numbness or weakness in any part of your body. Summary  Vertigo is the feeling that you or your surroundings are moving when they are not.  The Epley maneuver is an exercise that relieves symptoms of vertigo.  If the Epley maneuver is done correctly, it is considered safe. You can do it up to 3 times a day. This information is not intended to replace advice given to you by your health care provider. Make sure you discuss any questions you have with your health care provider. Document Released: 01/30/2013 Document Revised: 01/07/2017 Document Reviewed: 12/16/2015 Elsevier Patient Education  2020 [employer] to see you. I will call you with your  lab results from today and you can view them online.

## 2018-10-30 NOTE — Assessment & Plan Note (Signed)
Likely due to right sided cerumen impaction. See below. Also given instruction on home Eply maneuver.

## 2018-10-30 NOTE — Assessment & Plan Note (Signed)
Reviewed preventive care protocols, scheduled due services, and updated immunizations Discussed nutrition, exercise, diet, and healthy lifestyle.  Influenza vaccine given 

## 2018-10-30 NOTE — Assessment & Plan Note (Addendum)
Ceruminosis is noted. Only small amount of Wax is removed by syringing and manual debridement. Instructions for home care to prevent wax buildup are given.  She will call her ENT when she gets home.

## 2018-11-06 ENCOUNTER — Ambulatory Visit (HOSPITAL_BASED_OUTPATIENT_CLINIC_OR_DEPARTMENT_OTHER)
Admission: RE | Admit: 2018-11-06 | Discharge: 2018-11-06 | Disposition: A | Discharge status: Home / Self Care | Payer: BC Managed Care – PPO | Source: Ambulatory Visit | Attending: Family Medicine | Admitting: Family Medicine

## 2018-11-06 ENCOUNTER — Other Ambulatory Visit: Payer: BC Managed Care – PPO

## 2018-11-06 ENCOUNTER — Telehealth (INDEPENDENT_AMBULATORY_CARE_PROVIDER_SITE_OTHER): Payer: BC Managed Care – PPO | Admitting: Family Medicine

## 2018-11-06 ENCOUNTER — Ambulatory Visit: Payer: Self-pay | Admitting: *Deleted

## 2018-11-06 ENCOUNTER — Encounter: Payer: Self-pay | Admitting: Family Medicine

## 2018-11-06 ENCOUNTER — Other Ambulatory Visit: Payer: Self-pay

## 2018-11-06 VITALS — HR 54

## 2018-11-06 DIAGNOSIS — R1032 Left lower quadrant pain: Secondary | ICD-10-CM

## 2018-11-06 DIAGNOSIS — G8929 Other chronic pain: Secondary | ICD-10-CM | POA: Insufficient documentation

## 2018-11-06 LAB — COMPREHENSIVE METABOLIC PANEL
ALT: 25 U/L (ref 0–35)
AST: 26 U/L (ref 0–37)
Albumin: 4.2 g/dL (ref 3.5–5.2)
Alkaline Phosphatase: 46 U/L (ref 39–117)
BUN: 14 mg/dL (ref 6–23)
CO2: 29 mEq/L (ref 19–32)
Calcium: 9.2 mg/dL (ref 8.4–10.5)
Chloride: 104 mEq/L (ref 96–112)
Creatinine, Ser: 0.7 mg/dL (ref 0.40–1.20)
GFR: 93.06 mL/min (ref >60.00)
Glucose, Bld: 89 mg/dL (ref 70–99)
Potassium: 4 mEq/L (ref 3.5–5.1)
Sodium: 139 mEq/L (ref 135–145)
Total Bilirubin: 1 mg/dL (ref 0.2–1.2)
Total Protein: 6.5 g/dL (ref 6.0–8.3)

## 2018-11-06 LAB — CBC WITH DIFFERENTIAL/PLATELET
Basophils Absolute: 0 10*3/uL (ref 0.0–0.1)
Basophils Relative: 0.6 % (ref 0.0–3.0)
Eosinophils Absolute: 0.2 10*3/uL (ref 0.0–0.7)
Eosinophils Relative: 2.7 % (ref 0.0–5.0)
HCT: 40.2 % (ref 36.0–46.0)
Hemoglobin: 13.4 g/dL (ref 12.0–15.0)
Lymphocytes Relative: 32.5 % (ref 12.0–46.0)
Lymphs Abs: 1.9 10*3/uL (ref 0.7–4.0)
MCHC: 33.3 g/dL (ref 30.0–36.0)
MCV: 93.2 fl (ref 78.0–100.0)
Monocytes Absolute: 0.4 10*3/uL (ref 0.1–1.0)
Monocytes Relative: 7.1 % (ref 3.0–12.0)
Neutro Abs: 3.3 10*3/uL (ref 1.4–7.7)
Neutrophils Relative %: 57.1 % (ref 43.0–77.0)
Platelets: 189 10*3/uL (ref 150.0–400.0)
RBC: 4.32 Mil/uL (ref 3.87–5.11)
RDW: 12.3 % (ref 11.5–15.5)
WBC: 5.7 10*3/uL (ref 4.0–10.5)

## 2018-11-06 LAB — URINALYSIS, ROUTINE W REFLEX MICROSCOPIC
Bilirubin Urine: NEGATIVE
Hgb urine dipstick: NEGATIVE
Ketones, ur: NEGATIVE
Leukocytes,Ua: NEGATIVE
Nitrite: NEGATIVE
RBC / HPF: NONE SEEN (ref 0–?)
Specific Gravity, Urine: 1.025 (ref 1.000–1.030)
Total Protein, Urine: NEGATIVE
Urine Glucose: NEGATIVE
Urobilinogen, UA: 0.2 (ref 0.0–1.0)
pH: 5.5 (ref 5.0–8.0)

## 2018-11-06 NOTE — Progress Notes (Signed)
Virtual Visit via Video   Due to the COVID-19 pandemic, this visit was completed with telemedicine (audio/video) technology to reduce patient and provider exposure as well as to preserve personal protective equipment.   I connected with Delice Lesch by a video enabled telemedicine application and verified that I am speaking with the correct person using two identifiers. Location patient: Home Location provider: [facility], Office Persons participating in the virtual visit: Myrene Galas, MD   I discussed the limitations of evaluation and management by telemedicine and the availability of in person appointments. The patient expressed understanding and agreed to proceed.  Care Team   Patient Care Team: Dianne Dun, MD as PCP - General (Family Medicine)  Subjective:   HPI:   Pelvic pain- woke up at 4 am with pelvic pain, left > right.  LMP 10/23/18. Denies any changes in her bowels or urinary symptoms. Pain is constant 6/10 and worse when she presses down.  Following note from triage nurse this morning:  "Pt called stating that she is having sharp, and throbing pains in her left pelvis below her belly button; this woke her up last night 11/06/2018; she says that there is a tender spot in the area; application of heat did help; recommendations made per nurse triage protocol; she verbalized understanding; the pt sees Dr Dayton Martes, Rosine Door; pt transferred for scheduling."  No nausea, vomiting or fevers.  Never has had anything like this before.  Localized to left lower quadrant.    No dysuria, hematuria, blood in her stool.  Nothing really makes it better now , pressing on the area makes it worse.  Review of Systems  Constitutional: Negative.   HENT: Negative.   Eyes: Negative.   Respiratory: Negative.   Cardiovascular: Negative.   Gastrointestinal: Positive for abdominal pain. Negative for blood in stool, constipation, diarrhea, heartburn, melena, nausea  and vomiting.  Genitourinary: Negative for dysuria, flank pain, frequency, hematuria and urgency.  Skin: Negative.   Neurological: Negative.   Endo/Heme/Allergies: Negative.   Psychiatric/Behavioral: Negative.   All other systems reviewed and are negative.    Patient Active Problem List   Diagnosis Date Noted  . Chronic LLQ pain 11/06/2018  . Acute left lower quadrant pain 11/06/2018  . Well woman exam without gynecological exam 10/30/2018  . BPV (benign positional vertigo), right 10/30/2018  . Cerumen impaction 10/30/2018  . Fatigue 07/14/2017    Social History   Tobacco Use  . Smoking status: Never Smoker  . Smokeless tobacco: Never Used  Substance Use Topics  . Alcohol use: Yes    Comment: not with preg    Current Outpatient Medications:  Marland Kitchen  Multiple Vitamin (MULTIVITAMIN) tablet, Take 1 tablet by mouth daily., Disp: , Rfl:   No Known Allergies  Objective:  Pulse (!) 54   LMP 10/23/2018   Breastfeeding No   VITALS: Per patient if applicable, see vitals. GENERAL: Alert, appears well and in no acute distress. HEENT: Atraumatic, conjunctiva clear, no obvious abnormalities on inspection of external nose and ears. NECK: Normal movements of the head and neck. CARDIOPULMONARY: No increased WOB. Speaking in clear sentences. I:E ratio WNL.  MS: Moves all visible extremities without noticeable abnormality. Abd:  TTP over LLQ, no rebound. PSYCH: Pleasant and cooperative, well-groomed. Speech normal rate and rhythm. Affect is appropriate. Insight and judgement are appropriate. Attention is focused, linear, and appropriate.  NEURO: CN grossly intact. Oriented as arrived to appointment on time with no prompting. Moves both  UE equally.  SKIN: No obvious lesions, wounds, erythema, or cyanosis noted on face or hands.  Depression screen [facility] 2/9 10/30/2018 12/29/2017 07/14/2017  Decreased Interest 0 0 0  Down, Depressed, Hopeless 0 1 0  PHQ - 2 Score 0 1 0  Altered sleeping - 0 -   Tired, decreased energy - 2 -  Change in appetite - 0 -  Feeling bad or failure about yourself  - 0 -  Trouble concentrating - 0 -  Moving slowly or fidgety/restless - 0 -  Suicidal thoughts - 0 -  PHQ-9 Score - 3 -    Assessment and Plan:   Wilberta was seen today for abdominal pain.  Diagnoses and all orders for this visit:  Acute left lower quadrant pain -     CBC with Differential/Platelet -     Comprehensive metabolic panel -     Urinalysis, Routine w reflex microscopic -     US Pelvic Complete With Transvaginal; Future    . COVID-19 Education: The signs and symptoms of COVID-19 were discussed with the patient and how to seek care for testing if needed. The importance of social distancing was discussed today. . Reviewed expectations re: course of current medical issues. . Discussed self-management of symptoms. . Outlined signs and symptoms indicating need for more acute intervention. . Patient verbalized understanding and all questions were answered. Marland Kitchen Health Maintenance issues including appropriate healthy diet, exercise, and smoking avoidance were discussed with patient. . See orders for this visit as documented in the electronic medical record.  Arnette Norris, MD  Records requested if needed. Time spent: 25 minutes, of which >50% was spent in obtaining information about her symptoms, reviewing her previous labs, evaluations, and treatments, counseling her about her condition (please see the discussed topics above), and developing a plan to further investigate it; she had a number of questions which I addressed.

## 2018-11-06 NOTE — Telephone Encounter (Signed)
Pt seen by pcp

## 2018-11-06 NOTE — Telephone Encounter (Signed)
Pt called stating that she is having sharp, and throbing pains in her left pelvis below her belly button; this woke her up last night 11/06/2018; she says that there is a tender spot in the area; application of heat did help; recommendations made per nurse triage protocol; she verbalized understanding; the pt sees Dr Deborra Medina, Audrie Lia; pt transferred for scheduling  Reason for Disposition . [1] MILD-MODERATE pain AND [2] constant AND [3] present > 2 hours  Answer Assessment - Initial Assessment Questions 1. LOCATION: "Where does it hurt?"      Left lower pelvis below belly button 2. RADIATION: "Does the pain shoot anywhere else?" (e.g., lower back, groin, thighs)   Felt in back last night 3. ONSET: "When did the pain begin?" (e.g., minutes, hours or days ago)      11/06/2018 at 0400 4. SUDDEN: "Gradual or sudden onset?"    suddenly 5. PATTERN "Does the pain come and go, or is it constant?"    - If constant: "Is it getting better, staying the same, or worsening?"      (Note: Constant means the pain never goes away completely; most serious pain is constant and gets worse over time)     - If intermittent: "How long does it last?" "Do you have pain now?"     (Note: Intermittent means the pain goes away completely between bouts)   constant 6. SEVERITY: "How bad is the pain?"  (e.g., Scale 1-10; mild, moderate, or severe)   - MILD (1-3): doesn't interfere with normal activities, area soft and not tender to touch    - MODERATE (4-7): interferes with normal activities or awakens from sleep, tender to touch    - SEVERE (8-10): excruciating pain, doubled over, unable to do any normal activities      6-7 out of 10 7. RECURRENT SYMPTOM: "Have you ever had this type of pelvic pain before?" If so, ask: "When was the last time?" and "What happened that time?"      no 8. CAUSE: "What do you think is causing the pelvic pain?"     Not sure 9. RELIEVING/AGGRAVATING FACTORS: "What makes it better or worse?"  (e.g., activity/rest, sexual intercourse, voiding, passing stool)     Heating pad helped  10. OTHER SYMPTOMS: "Has there been any other symptoms?" (e.g., fever, vaginal bleeding, vaginal discharge, diarrhea, constipation, or voiding problems?"     no 11. PREGNANCY: "Is there any chance you are pregnant?" "When was your last menstrual period?"       No LMP 10/23/2018  Protocols used: PELVIC PAIN - [facility]

## 2018-11-06 NOTE — Assessment & Plan Note (Signed)
>  25 minutes spent in face to face time with patient, >50% spent in counselling or coordination of care- very wide differential diagnosis and difficult to assess over televisit.  Will bring her into office ASAP for stat labs and UA and order abdomen ultrasound with transvaginal US- which will be able to rule out ovarian cyst, UA could lead to ? Kidney stones, etc...but will likely need CT of abd/pelvis.  She is aware to go to ED of new symptoms develop or if anything worsens. The patient indicates understanding of these issues and agrees with the plan. Orders Placed This Encounter  Procedures  . US Pelvic Complete With Transvaginal  . CBC with Differential/Platelet  . Comprehensive metabolic panel  . Urinalysis, Routine w reflex microscopic

## 2018-11-10 ENCOUNTER — Other Ambulatory Visit: Payer: Self-pay

## 2018-11-10 ENCOUNTER — Telehealth: Payer: Self-pay

## 2018-11-10 DIAGNOSIS — Z20822 Contact with and (suspected) exposure to covid-19: Secondary | ICD-10-CM

## 2018-11-10 NOTE — Telephone Encounter (Signed)
Copied from [place] [id number]. Topic: General - Other >> Nov 10, 2018 11:25 AM Celene Kras A wrote: Reason for CRM: Pt called stating her daughter has been in contact with someone who tested positive for covid. Pt is requesting advice on quarantining and testing. Please advise.

## 2018-11-12 LAB — NOVEL CORONAVIRUS, NAA: SARS-CoV-2, NAA: NOT DETECTED

## 2018-11-14 NOTE — Telephone Encounter (Signed)
Sent pt a MyChart message/thx dmf 

## 2019-01-01 ENCOUNTER — Ambulatory Visit (INDEPENDENT_AMBULATORY_CARE_PROVIDER_SITE_OTHER): Payer: Self-pay | Admitting: Orthopedic Surgery

## 2019-01-01 DIAGNOSIS — M7752 Other enthesopathy of left foot: Secondary | ICD-10-CM

## 2019-01-03 ENCOUNTER — Encounter: Payer: Self-pay | Admitting: Orthopedic Surgery

## 2019-01-03 ENCOUNTER — Other Ambulatory Visit: Payer: Self-pay

## 2019-01-03 NOTE — Progress Notes (Signed)
   Post-Op Visit Note   Patient: MARLIE KUENNEN           Date of Birth: 11/21/1980           MRN: [id number] Visit Date: 01/01/2019 PCP: Lucille Passy, MD   Assessment & Plan:  Chief Complaint: No chief complaint on file.  Visit Diagnoses:  1. 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  She changed her shoes and she developed left anterior tibial pain.  Looked at the shoes and she does have a higher riding flap of tissue over the anterior tibial tendon.  This is where she is having pain.  First 20 minutes of run she feels the pain but then it goes away.  On exam she has normal foot alignment.  Palpable pedal pulses.  She does have a little bit of thickening of the anterior tib tendon sheath on the left compared to the right.  This is confirmed on ultrasound.  She has good ankle stability.  No tenderness of the posterior tib Achilles or peroneal tendons.  Impression is anterior tib tendinitis from different shoewear which is giving her essentially inflammation of the tendon sheath over that tendon.  Looked at the shoes and I think we could cut the top of the shoe off where it is rubbing.  Short of that she may need to change shoes.  Follow-up with me as needed.  Follow-Up Instructions: No follow-ups on file.   Orders:  No orders of the defined types were placed in this encounter.  No orders of the defined types were placed in this encounter.   Imaging: No results found.  PMFS History: Patient Active Problem List   Diagnosis Date Noted  . Chronic LLQ pain 11/06/2018  . Acute left lower quadrant pain 11/06/2018  . Well woman exam without gynecological exam 10/30/2018  . BPV (benign positional vertigo), right 10/30/2018  . Cerumen impaction 10/30/2018  . Fatigue 07/14/2017   Past Medical History:  Diagnosis Date  . History of anorexia nervosa   . History of chicken pox   . PONV (postoperative nausea and vomiting)     Family  History  Problem Relation Age of Onset  . Hypertension Mother   . Cancer Mother        breast  . Arthritis Mother   . Miscarriages / [place] Mother   . Cancer Father        skin  . Asthma Father   . Hyperlipidemia Father     Past Surgical History:  Procedure Laterality Date  . AURAL ATRESIA REPAIR Bilateral 1989   Multiple middle and outer ear surgeries/bilateral/30-years-ago  . DILATION AND CURETTAGE OF UTERUS    . OTHER SURGICAL HISTORY     multiple ear surgeries as a child   Social History   Occupational History  . Not on file  Tobacco Use  . Smoking status: Never Smoker  . Smokeless tobacco: Never Used  Substance and Sexual Activity  . Alcohol use: Yes    Comment: not with preg  . Drug use: No  . Sexual activity: Yes    Birth control/protection: None    Comment: Husband-Vasectomy

## 2019-04-02 ENCOUNTER — Ambulatory Visit: Payer: BC Managed Care – PPO | Attending: Internal Medicine

## 2019-04-02 DIAGNOSIS — Z20822 Contact with and (suspected) exposure to covid-19: Secondary | ICD-10-CM

## 2019-04-03 LAB — NOVEL CORONAVIRUS, NAA: SARS-CoV-2, NAA: NOT DETECTED

## 2020-01-05 IMAGING — US US PELVIS COMPLETE WITH TRANSVAGINAL
3 series · 13 of 25 positions shown · non-contrast
Comparison: None

CLINICAL DATA: Acute left-sided pelvic pain

EXAM:
TRANSABDOMINAL AND TRANSVAGINAL ULTRASOUND OF PELVIS
TECHNIQUE: Both transabdominal and transvaginal ultrasound examinations of the
pelvis were performed. Transabdominal technique was performed for
global imaging of the pelvis including uterus, ovaries, adnexal
regions, and pelvic cul-de-sac. It was necessary to proceed with
endovaginal exam following the transabdominal exam to visualize the
uterus endometrium ovaries.

[Series 1: us pelvis complete with transvaginal · 4 of 54 slices shown (1 of 3)]
[im 1/54]
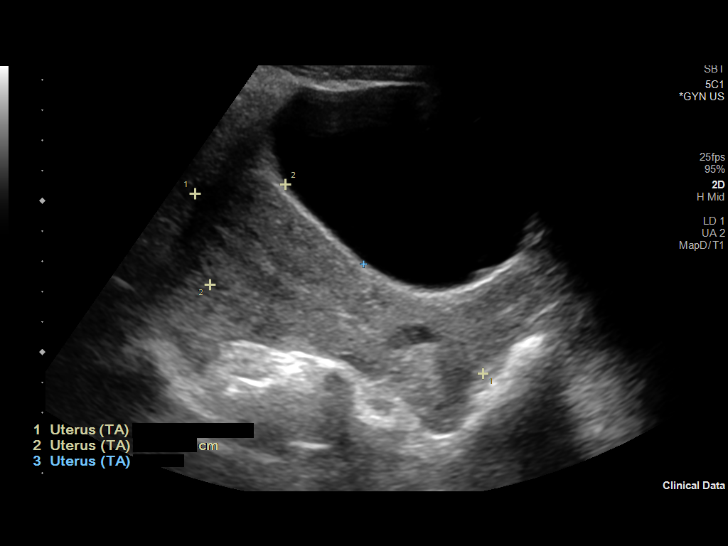
[im 16/54]
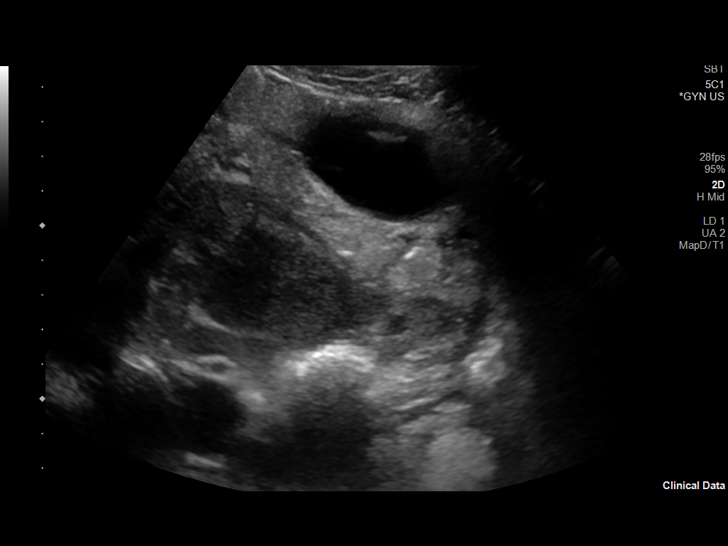
[im 31/54]
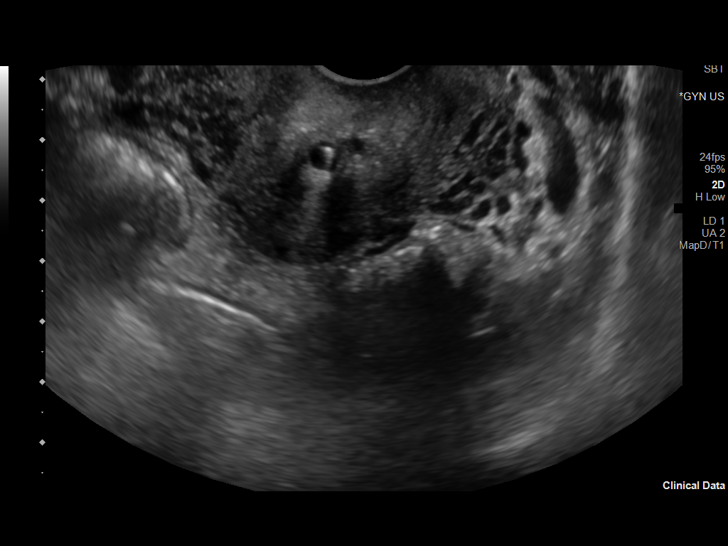
[im 46/54]
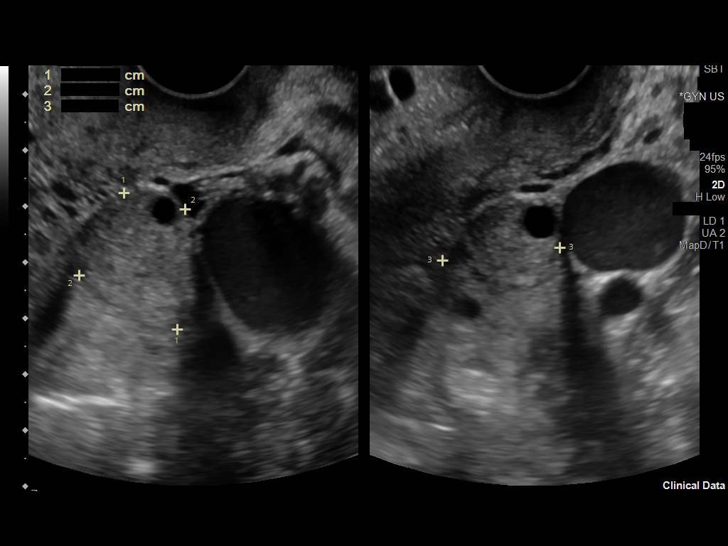

[Series 1: us pelvis complete with transvaginal · 5 of 57 slices shown (2 of 3)]
[im 1/57]
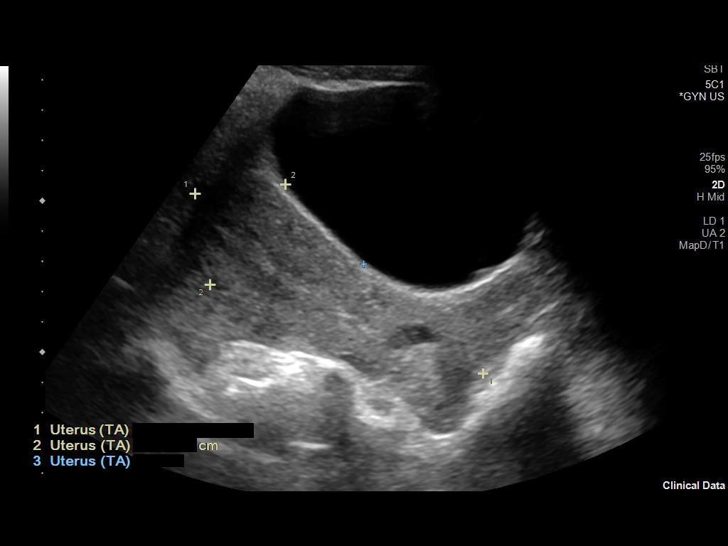
[im 15/57]
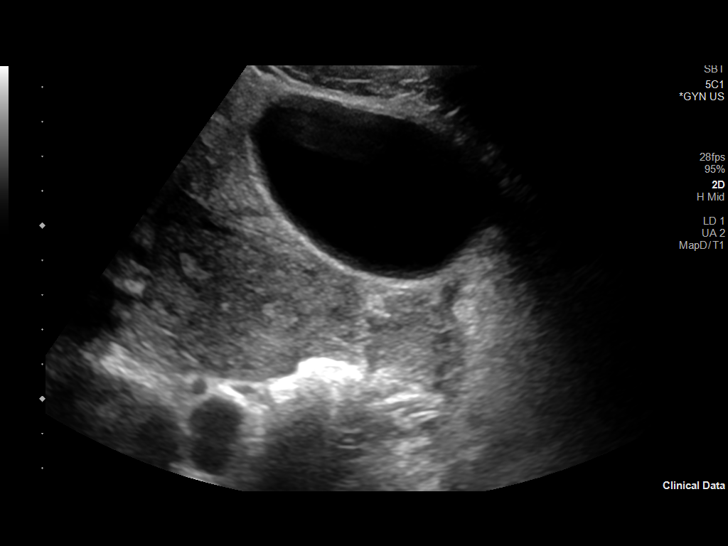
[im 29/57]
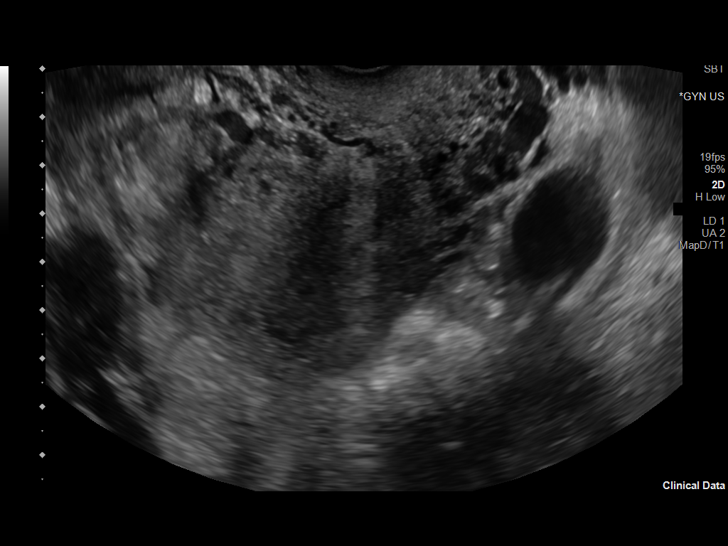
[im 43/57]
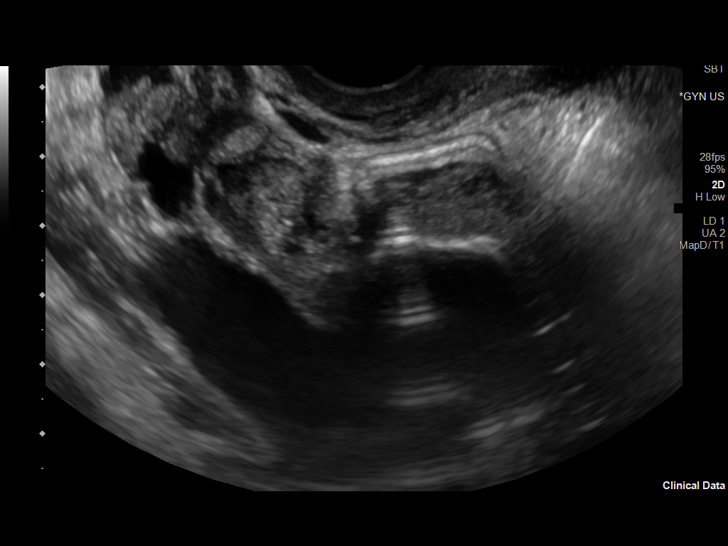
[im 57/57]
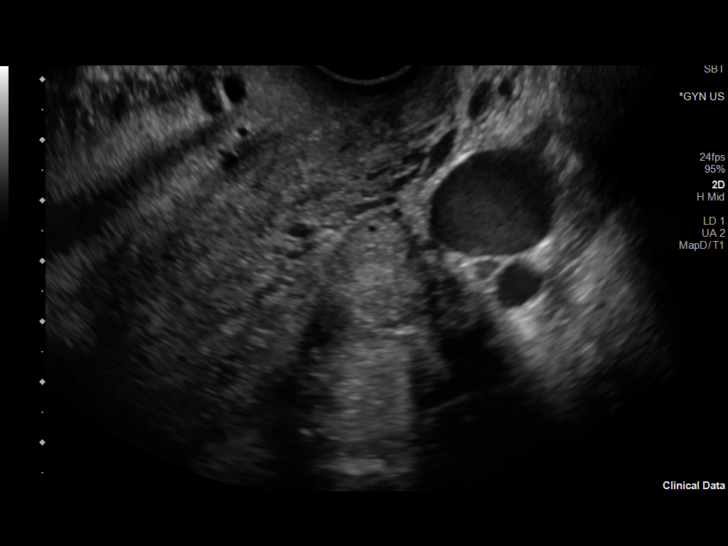

[Series 1: us pelvis complete with transvaginal · 4 of 56 slices shown (3 of 3)]
[im 8/56]
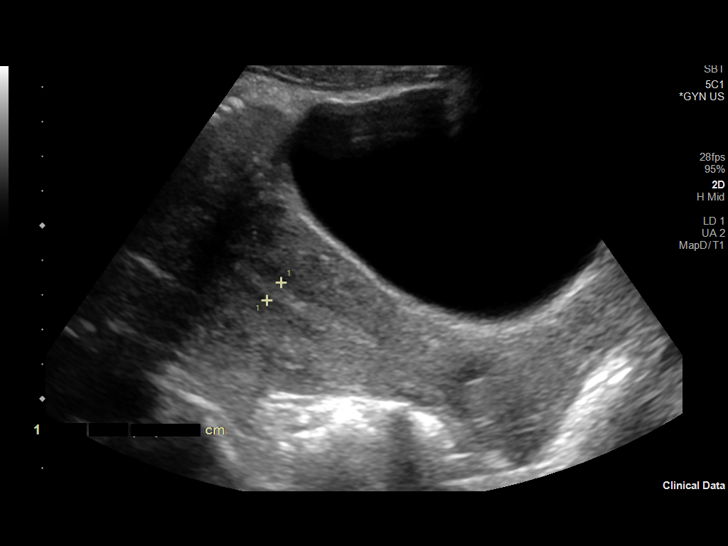
[im 24/56]
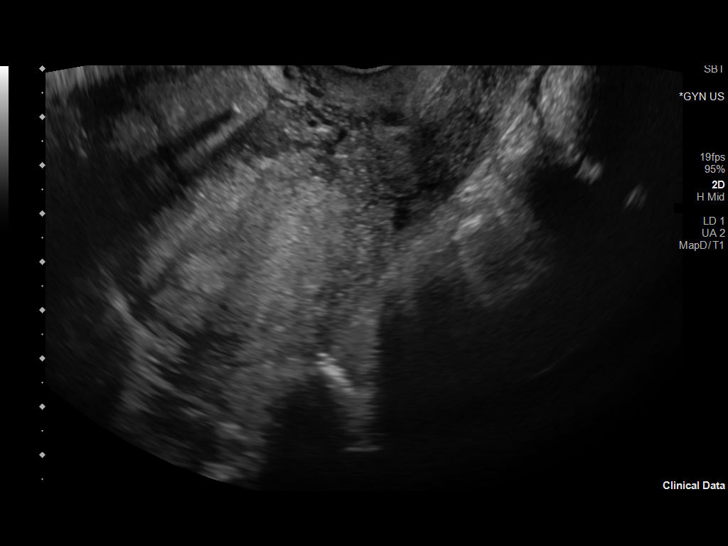
[im 40/56]
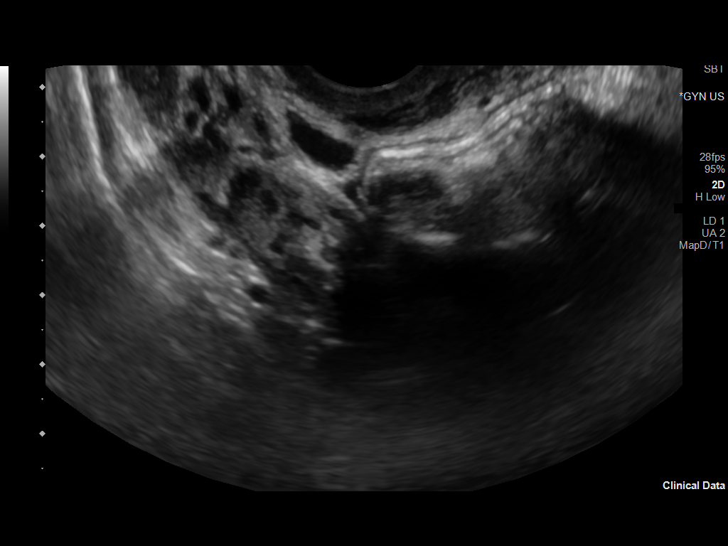
[im 56/56]
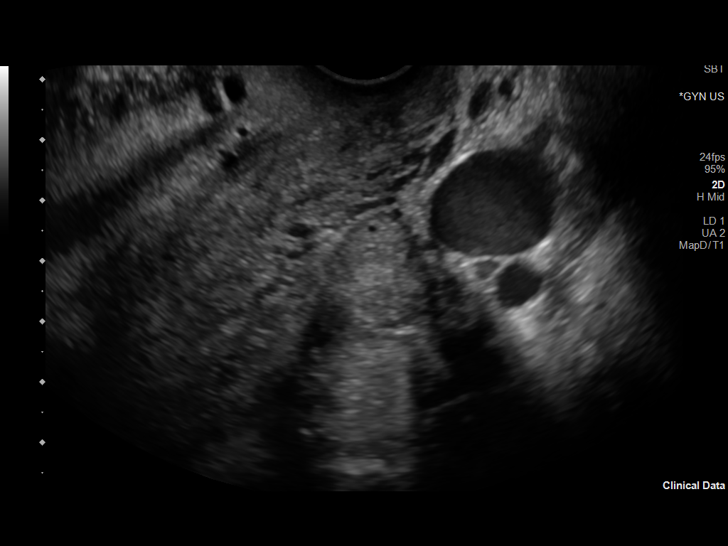

[13 of 25 positions shown; findings below may reference images not displayed]

FINDINGS: Uterus

Measurements: 10.6 x 5 x 5.9 cm = volume: 164 mL. No fibroids or
other mass visualized.

Endometrium

Thickness: 9 mm.  No focal abnormality visualized.

Right ovary

Measurements: 2.2 x 1.7 x 2.1 cm = volume: 4 mL. Normal
appearance/no adnexal mass.

Left ovary

Measurements: 2.6 x 2.2 x 2.1 cm = volume: 6.5 mL. Normal
appearance/no adnexal mass.

Other findings

Small free fluid in the pelvis
IMPRESSION: Small free fluid in the pelvis. Otherwise negative pelvic
ultrasound.

## 2020-02-22 ENCOUNTER — Other Ambulatory Visit: Payer: BC Managed Care – PPO

## 2020-04-21 ENCOUNTER — Ambulatory Visit: Payer: Self-pay | Admitting: Internal Medicine

## 2020-07-25 ENCOUNTER — Ambulatory Visit (HOSPITAL_BASED_OUTPATIENT_CLINIC_OR_DEPARTMENT_OTHER): Payer: Self-pay | Admitting: Nurse Practitioner

## 2020-08-06 ENCOUNTER — Encounter: Payer: Self-pay | Admitting: Internal Medicine

## 2020-08-21 ENCOUNTER — Ambulatory Visit: Payer: Self-pay | Admitting: Family Medicine

## 2020-09-17 ENCOUNTER — Encounter: Payer: Self-pay | Admitting: Family Medicine

## 2022-10-13 ENCOUNTER — Ambulatory Visit
Admission: RE | Admit: 2022-10-13 | Discharge: 2022-10-13 | Disposition: A | Discharge status: Home / Self Care | Payer: BC Managed Care – PPO | Source: Ambulatory Visit | Attending: Family Medicine | Admitting: Family Medicine

## 2022-10-13 ENCOUNTER — Other Ambulatory Visit: Payer: Self-pay | Admitting: Family Medicine

## 2022-10-13 DIAGNOSIS — R0781 Pleurodynia: Secondary | ICD-10-CM

## 2022-12-29 ENCOUNTER — Other Ambulatory Visit: Payer: Self-pay | Admitting: Medical Genetics

## 2022-12-29 DIAGNOSIS — Z006 Encounter for examination for normal comparison and control in clinical research program: Secondary | ICD-10-CM

## 2023-01-31 ENCOUNTER — Other Ambulatory Visit (HOSPITAL_COMMUNITY): Payer: BC Managed Care – PPO

## 2023-02-16 ENCOUNTER — Other Ambulatory Visit (HOSPITAL_COMMUNITY): Payer: 59 | Attending: Medical Genetics

## 2023-05-10 ENCOUNTER — Ambulatory Visit: Admitting: Sports Medicine

## 2023-05-10 ENCOUNTER — Other Ambulatory Visit: Payer: Self-pay

## 2023-05-10 ENCOUNTER — Encounter: Payer: Self-pay | Admitting: Sports Medicine

## 2023-05-10 VITALS — BP 114/68 | Ht 67.0 in | Wt 130.0 lb

## 2023-05-10 DIAGNOSIS — S76019A Strain of muscle, fascia and tendon of unspecified hip, initial encounter: Secondary | ICD-10-CM | POA: Insufficient documentation

## 2023-05-10 DIAGNOSIS — M216X2 Other acquired deformities of left foot: Secondary | ICD-10-CM | POA: Diagnosis not present

## 2023-05-10 DIAGNOSIS — M67951 Unspecified disorder of synovium and tendon, right thigh: Secondary | ICD-10-CM | POA: Insufficient documentation

## 2023-05-10 DIAGNOSIS — M25551 Pain in right hip: Secondary | ICD-10-CM | POA: Diagnosis not present

## 2023-05-10 DIAGNOSIS — M7918 Myalgia, other site: Secondary | ICD-10-CM | POA: Insufficient documentation

## 2023-05-10 NOTE — Assessment & Plan Note (Signed)
-   The patient has midfoot pronation on the left that is effecting her running gait.  - Scaphoid pad added to her insole today which corrected this.  - We will give her two weeks to adapt with running to evaluate if she would be a good candidate for custom orthotics - In the mean time she will also work on hip abductor exercises for the right side

## 2023-05-10 NOTE — Progress Notes (Signed)
 Mackenzie Delgado - 43 y.o. female MRN [id number]  Date of birth: [birth year]  PCP: Pcp, No  Subjective:  No chief complaint on file.  Right hip pain  HPI: Past Medical, Surgical, Social, and Family History Reviewed & Updated per EMR.   Patient is a 43 y.o. female here for moderate, aching, nonradiating, pain in the right posterior gluteus area that occasionally wraps around the lateral hip, is worsened with running, and improved with rest.  She was seen recently for SI joint pain on that side which was alleviated after a steroid injection.  She has not had any injuries or trauma to the area, numbness, weakness, fevers, or chills.   Past Surgical History:  Procedure Laterality Date   AURAL ATRESIA REPAIR Bilateral 1989   Multiple middle and outer ear surgeries/bilateral/30-years-ago   DILATION AND CURETTAGE OF UTERUS     OTHER SURGICAL HISTORY     multiple ear surgeries as a child    No Known Allergies      Objective:  Physical Exam: VS: BP:114/68  HR: bpm  TEMP: ( )  RESP:   HT:5\' 7"  (170.2 cm)   WT:130 lb (59 kg)  BMI:20.36  Gen: Well developed, NAD, speaks clearly, comfortable in exam room Respiratory: Normal work of breathing on room air, no respiratory distress Skin: No rashes, abrasions, or ecchymosis MSK:  Inspection of the feet reveals midfoot pronation on the left with increased lateral foot loading bilaterally and mild valgus formation at the 1st MTP bilat  Right Hip: Gait: running reveals a slightly shortened stride on the left with some crossing of the midline with knee flexion Leg length: equal ROM: full and no pain elicited Strength Flexion: 5/5, Extension: 5/5, Abduction: 5/5 on left and 4/5 on right,  Trendelenburg sign mildly positive on the right Greater trochanter NT Ober's neg No tenderness over piriformis.  Posterior Hip: FABER positive, Piriformis negative FADIR negative for anterior hip pain, log roll negative Neuro: NVID  Limited US of the  right posterior hip:  The femoral head and neck are well visualized w/out any bony cortex changes or edema.  The gluteus medius and minimus are visualized from the iliac crest to the greater trochanter in LAX revealing some hyper echoic changes of the distal minimus fibers consistent with some old scarring.  The greater trochanter has no overlying fluid accumulation The piriformis reveals some hypoechoic fluid accumulation at the origin w/ hypoechoic changes within the muscle itself  Summary: tendinopathy of the distal gluteus minimus and piriformis edema  Ultrasound and interpretation by Dr. Webb Silversmith and Dr. Darrick Penna    Assessment & Plan:   Piriformis muscle pain - Audine's US demonstrated some edema within the right piriformis muscle, particularly at the proximal origin. This may be due to compensation for her gait abnormality.  - She will start piriformis exercises with stretching which will hopefully alleviate her pain.   Pronation of left foot - The patient has midfoot pronation on the left that is effecting her running gait.  - Scaphoid pad added to her insole today which corrected this.  - We will give her two weeks to adapt with running to evaluate if she would be a good candidate for custom orthotics - In the mean time she will also work on hip abductor exercises for the right side  Strain of tendon of gluteus muscle - US revealed some scarring of the distal gluteus minimus fibers but no acute tears.  - This is likely adding to her weakness on  that side and compensatory changes with her running gait along with a recent sharp increase in running load without rest.  - She will do hip abduction rehab as above and follow up in 2 weeks    Rica Mote MD [facility] Sports Medicine Fellow  I observed and examined the patient with the [facility] resident and agree with assessment and plan.  Note reviewed and modified by me. Sterling Big, MD

## 2023-05-10 NOTE — Assessment & Plan Note (Addendum)
-   US revealed some scarring of the distal gluteus minimus fibers but no acute tears.  - This is likely adding to her weakness on that side and compensatory changes with her running gait along with a recent sharp increase in running load without rest.  - She will do hip abduction rehab as above and follow up in 2 weeks

## 2023-05-10 NOTE — Assessment & Plan Note (Signed)
-   Mackenzie Delgado's US demonstrated some edema within the right piriformis muscle, particularly at the proximal origin. This may be due to compensation for her gait abnormality.  - She will start piriformis exercises with stretching which will hopefully alleviate her pain.

## 2023-05-24 ENCOUNTER — Encounter: Payer: Self-pay | Admitting: Sports Medicine

## 2023-05-24 ENCOUNTER — Ambulatory Visit: Admitting: Sports Medicine

## 2023-05-24 ENCOUNTER — Other Ambulatory Visit: Payer: Self-pay

## 2023-05-24 VITALS — BP 110/62 | Ht 67.0 in | Wt 130.0 lb

## 2023-05-24 DIAGNOSIS — M25551 Pain in right hip: Secondary | ICD-10-CM

## 2023-05-24 DIAGNOSIS — M6289 Other specified disorders of muscle: Secondary | ICD-10-CM | POA: Diagnosis not present

## 2023-05-24 DIAGNOSIS — S76019A Strain of muscle, fascia and tendon of unspecified hip, initial encounter: Secondary | ICD-10-CM

## 2023-05-24 MED ORDER — NITROGLYCERIN 0.2 MG/HR TD PT24: MEDICATED_PATCH | TRANSDERMAL | 1 refills | Status: AC

## 2023-05-24 MED ORDER — MELOXICAM 15 MG PO TABS: 15.0000 mg | ORAL_TABLET | Freq: Every day | ORAL | 0 refills | Status: AC

## 2023-05-24 NOTE — Assessment & Plan Note (Signed)
-   Mackenzie Delgado's pain in the gluteus muscle has resolved and she has gotten a marked increase in her hip abduction strength with consistent performance of the exercises. - We will have her continue these for strengthening and maintenance as well as the above plan.

## 2023-05-24 NOTE — Patient Instructions (Signed)

## 2023-05-24 NOTE — Assessment & Plan Note (Addendum)
-   Mackenzie Delgado was very consistent with the hip abduction exercises given at the last visit and showed rapid improvement in her abduction strength. - Unfortunately, she likely had this injury with pool running and cycling which was both a rapid increase in inactivity that was not previously performed as well as overload on the tensor fascia muscle with resisted flexion in the water. -Ultrasound reveals small partial tearing of the mid muscle belly of the tensor fascia lata as well as in the fascial plane between the Clute medius and minimus. - She will start nitroglycerin protocol to increase healing and a daily oral meloxicam for anti-inflammatory - We discussed at length a slow increase in her running.  She will run at a slower pace and avoid any pain greater than a 3/10.  She will do 3 sessions, 1/day, at 20 minutes then progressed to 25 for 3 sessions and then 30 for 3 sessions. - I will follow-up with her in 2 weeks to evaluate her progress.

## 2023-05-24 NOTE — Progress Notes (Signed)
 Mackenzie Delgado - 43 y.o. female MRN [id number]  Date of birth: 02-23-1980  PCP: Pcp, No  Subjective:  No chief complaint on file. Right lateral hip pain  HPI: Past Medical, Surgical, Social, and Family History Reviewed & Updated per EMR.   Patient is a 43 y.o. female here for follow up on her right gluteus muscle strain, last seen on 05/10/2023. The patient has tried daily hip abduction exercises which has improved her strength and resolved the pain in her gluteus muscle.  Almost immediately after starting these she changed her running routine to daily cycling and doing pool runs at intervals.  After 2-3 days if she developed acute pain in the lateral hip muscles that has worsened and because of this she is no longer running.  She has not had any falls, trauma, swelling, redness, warmth, fever, chills, numbness, or weakness.  She has been taking ibuprofen 600 mg twice daily for pain but this has not been helping.  Past Medical History:  Diagnosis Date   History of anorexia nervosa    History of chicken pox    PONV (postoperative nausea and vomiting)     Current Outpatient Medications on File Prior to Visit  Medication Sig Dispense Refill   Multiple Vitamin (MULTIVITAMIN) tablet Take 1 tablet by mouth daily.     No current facility-administered medications on file prior to visit.    Past Surgical History:  Procedure Laterality Date   AURAL ATRESIA REPAIR Bilateral 1989   Multiple middle and outer ear surgeries/bilateral/30-years-ago   DILATION AND CURETTAGE OF UTERUS     OTHER SURGICAL HISTORY     multiple ear surgeries as a child    No Known Allergies      Objective:  Physical Exam: VS: BP:110/62  HR: bpm  TEMP: ( )  RESP:   HT:5\' 7"  (170.2 cm)   WT:130 lb (59 kg)  BMI:20.36  Gen: NAD, speaks clearly, comfortable in exam room Respiratory: Normal respiratory effort on room air. No signs of distress Skin: No rashes, abrasions, or ecchymosis MSK:  Right Hip: running  gait shows fairly neutral stance phase and pushoff compared to previous. There is increased rotation in the left hip and stiffness in the right ROM: Hip range of motion is full.  There is some discomfort over the right lateral hip just distal to the iliac crest on the anterior aspect with passive internal rotation Strength Flexion: 5/5, Extension: 5/5, Abduction: 5/5, Adduction: 5/5 Trendelenburg sign negative Greater trochanter NT Ober's negative No tenderness over piriformis.  Tenderness to palpation over the mid belly musculature of the tensor fascia lata/gluteus medius/minimus Posterior Hip: FABER negative for SI pain, Piriformis negative FADIR negative for anterior hip pain, log roll negative  Limited US of the Right Lateral Hip:  The greater trochanter was visualized in long and short axis The distal attachment of the glue medius, glute minimus showed no changes The tensor fascia lata was followed from the proximal origin on the iliac crest distally and showed hypoechoic changes within the mid belly musculature just anterior to the greater trochanter. Doppler revealed increased flow in this area The anterior aspect of the glut medius and minimus revealed some hypoechoic changes within the fascial plane and increased flow on Doppler as well  Summary: Partial tear of the tensor fascia lata muscle.  Partial tearing in the myofascial plane between the gluteus medius and TFL Ultrasound and interpretation by Dr. Webb Silversmith and Dr. Darrick Penna    Assessment & Plan:   Tensor  fascia lata syndrome - Mackenzie Delgado was very consistent with the hip abduction exercises given at the last visit and showed rapid improvement in her abduction strength. - Unfortunately, she likely had this injury with pool running and cycling which was both a rapid increase in inactivity that was not previously performed as well as overload on the tensor fascia muscle with resisted flexion in the water. -Ultrasound reveals small  partial tearing of the mid muscle belly of the tensor fascia lata as well as in the fascial plane between the Clute medius and minimus. - She will start nitroglycerin protocol to increase healing and a daily oral meloxicam for anti-inflammatory - We discussed at length a slow increase in her running.  She will run at a slower pace and avoid any pain greater than a 3/10.  She will do 3 sessions, 1/day, at 20 minutes then progressed to 25 for 3 sessions and then 30 for 3 sessions. - I will follow-up with her in 2 weeks to evaluate her progress.  Strain of tendon of gluteus muscle - Mackenzie Delgado's pain in the gluteus muscle has resolved and she has gotten a marked increase in her hip abduction strength with consistent performance of the exercises. - We will have her continue these for strengthening and maintenance as well as the above plan.    Mackenzie Bridges MD [facility] Sports Medicine Fellow  I observed and examined the patient with the [facility] resident and agree with assessment and plan.  Note reviewed and modified by me. Mackenzie Hicks, MD

## 2023-06-08 ENCOUNTER — Ambulatory Visit: Admitting: Family Medicine

## 2023-06-22 ENCOUNTER — Ambulatory Visit: Admitting: Family Medicine

## 2023-06-22 ENCOUNTER — Other Ambulatory Visit: Payer: Self-pay

## 2023-06-22 VITALS — BP 100/62 | Ht 67.0 in | Wt 130.0 lb

## 2023-06-22 DIAGNOSIS — M25551 Pain in right hip: Secondary | ICD-10-CM | POA: Diagnosis not present

## 2023-06-22 DIAGNOSIS — M6289 Other specified disorders of muscle: Secondary | ICD-10-CM

## 2023-06-23 ENCOUNTER — Encounter: Payer: Self-pay | Admitting: Family Medicine

## 2023-06-23 NOTE — Progress Notes (Signed)
 Mackenzie Delgado - 43 y.o. female MRN [id number]  Date of birth: Sep 07, 1980  PCP: Pcp, No  Subjective:  No chief complaint on file. Right tensor fascia lata partial tear  HPI: Past Medical, Surgical, Social, and Family History Reviewed & Updated per EMR.   Patient is a 43 y.o. female here for follow up on a partial tear of her right tensor fascia lata muscle and previous gluteal strain. She has slowly increased her running as discussed previously and has been diligent about continuing her hip abductor exercises. She denies any pain above 3/10 and ran up to 8 miles this morning. She stopped using the nitro patches after a day or two because she was not feeling any effects. She denies any new trauma and feels like she is progressing but wanted to make sure she wasn't injuring anything with the ongoing, low level pain.   Past Medical History:  Diagnosis Date   History of anorexia nervosa    History of chicken pox    PONV (postoperative nausea and vomiting)     Current Outpatient Medications on File Prior to Visit  Medication Sig Dispense Refill   meloxicam  (MOBIC ) 15 MG tablet Take 1 tablet (15 mg total) by mouth daily. 30 tablet 0   Multiple Vitamin (MULTIVITAMIN) tablet Take 1 tablet by mouth daily.     nitroGLYCERIN  (NITRODUR - DOSED IN MG/24 HR) 0.2 mg/hr patch Use 1/4 patch daily to the affected area. 30 patch 1   No current facility-administered medications on file prior to visit.    Past Surgical History:  Procedure Laterality Date   AURAL ATRESIA REPAIR Bilateral 1989   Multiple middle and outer ear surgeries/bilateral/30-years-ago   DILATION AND CURETTAGE OF UTERUS     OTHER SURGICAL HISTORY     multiple ear surgeries as a child    No Known Allergies      Objective:  Physical Exam: VS: BP:100/62  HR: bpm  TEMP: ( )  RESP:   HT:5\' 7"  (170.2 cm)   WT:130 lb (59 kg)  BMI:20.36  Gen: NAD, speaks clearly, comfortable in exam room Respiratory: Normal respiratory  effort on room air. No signs of distress Skin: No rashes, abrasions, or ecchymosis MSK:  Inspection of the right hip is normal ROM is full Strength 5/5 in flexion and abduction There is no TTP over the greater trochanter or hip abductor muscles IR/ER is not painful Trendelenburg negative  Limited ultrasound of right lateral hip The right iliac crest is visualized in LAX with the proximal attachments of the TFL, glut med and glut min.  There is a small area of hyper echoic change at the myofascial junction between the TFL and glut med w/ increased vascularity and no hypoechoic increase in fluid compared to previous The proximal attachment of the glut min to the iliac crest has some hyper echoic changes with no defects in the muscle or hypo echoic fluid collection  Summary: healing partial tears of the proximal glut minimus and fascial junction of TFL and glut med.   Ultrasound and interpretation by Dr. Dannis Dy and Dr. Howard Macho    Assessment & Plan:   Tensor fascia lata syndrome - The patient has been able to run up to 8 miles with no more than a 3/10 level of pain. - On limited ultrasound there is routine healing of the small tear at the gluteus minimus attachment point on the iliac crest.  There is also advanced healing of the previous tear seen at the tensor fascia  lata/glute medius fascial plane. -At this point she will continue her previous grade and increase in running avoiding any pain levels above a 3/10. - I did discuss the importance of continuing her hip abduction exercises. - We will follow-up with her in 3 weeks for reevaluation and ultrasound, or sooner as needed.    Berneda Bridges MD [facility] Sports Medicine Fellow

## 2023-06-23 NOTE — Assessment & Plan Note (Signed)
-   The patient has been able to run up to 8 miles with no more than a 3/10 level of pain. - On limited ultrasound there is routine healing of the small tear at the gluteus minimus attachment point on the iliac crest.  There is also advanced healing of the previous tear seen at the tensor fascia lata/glute medius fascial plane. -At this point she will continue her previous grade and increase in running avoiding any pain levels above a 3/10. - I did discuss the importance of continuing her hip abduction exercises. - We will follow-up with her in 3 weeks for reevaluation and ultrasound, or sooner as needed.

## 2023-07-12 ENCOUNTER — Ambulatory Visit: Admitting: Family Medicine

## 2023-09-06 ENCOUNTER — Other Ambulatory Visit: Payer: Self-pay

## 2023-09-06 ENCOUNTER — Ambulatory Visit: Admitting: Sports Medicine

## 2023-09-06 VITALS — BP 122/70 | Ht 67.0 in | Wt 125.0 lb

## 2023-09-06 DIAGNOSIS — M216X2 Other acquired deformities of left foot: Secondary | ICD-10-CM | POA: Diagnosis not present

## 2023-09-06 DIAGNOSIS — M25562 Pain in left knee: Secondary | ICD-10-CM | POA: Insufficient documentation

## 2023-09-06 DIAGNOSIS — M6289 Other specified disorders of muscle: Secondary | ICD-10-CM | POA: Diagnosis not present

## 2023-09-06 MED ORDER — COLCHICINE 0.6 MG PO TABS: 0.6000 mg | ORAL_TABLET | Freq: Two times a day (BID) | ORAL | 1 refills | Status: AC

## 2023-09-06 NOTE — Assessment & Plan Note (Signed)
 This seems to have completely resolved with very strong hip abductors and no pain on current testing

## 2023-09-06 NOTE — Assessment & Plan Note (Signed)
 With her medial knee meniscal contusion I think we need to give her more support to block pronation She was given sports insoles On the left sports insoles she was given a scaphoid pad Running gait showed less pronation once these were in place

## 2023-09-06 NOTE — Assessment & Plan Note (Signed)
 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

## 2023-09-06 NOTE — Progress Notes (Signed)
 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 Recently she took a week off of running while allowing another issue to heal and when she returned to running she was getting some sharp pain at a level of 6-7 on the medial aspect of her left knee. She did not recall any specific injury to her knee. She did not note any significant swelling. She did not note any giving way or locking.  However when she has tried to return running continuously the pain has returned. She did try running 2 minutes and walking 1 minute and had very little pain with that  Physical examination Thin and very athletic appearing white female BP 122/70   Ht 5' 7 (1.702 m)   Wt 125 lb (56.7 kg)   BMI 19.58 kg/m   Knee: Left Normal to inspection with no erythema or effusion or obvious bony abnormalities. At rest she has some genu valgus greater on the left Palpation normal with no warmth or joint line tenderness or patellar tenderness or condyle tenderness.  Some mild TTP along the anterior third of the Medial joint line ROM normal in flexion and extension and lower leg rotation. Ligaments with solid consistent endpoints including ACL, PCL, LCL, MCL. Negative Mcmurray's and provocative meniscal tests. Non painful patellar compression. Patellar and quadriceps tendons unremarkable. Hamstring and quadriceps strength is normal.  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 Harvey, MD

## 2023-09-28 ENCOUNTER — Other Ambulatory Visit: Payer: Self-pay | Admitting: Sports Medicine

## 2023-10-11 ENCOUNTER — Ambulatory Visit: Admitting: Sports Medicine

## 2023-10-11 VITALS — BP 102/70 | Ht 67.0 in | Wt 125.0 lb

## 2023-10-11 DIAGNOSIS — M7052 Other bursitis of knee, left knee: Secondary | ICD-10-CM | POA: Diagnosis not present

## 2023-10-11 DIAGNOSIS — M25562 Pain in left knee: Secondary | ICD-10-CM

## 2023-10-11 NOTE — Progress Notes (Signed)
 PCP: Pcp, No  Subjective:   HPI: Patient is a 43 y.o. female here for follow-up of left knee pain.  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  At that time she was recommended to have a rest from running, as well as starting colchicine .  She did not tolerate the colchicine  and had to come off of it completely.  Today she states she is actually improved.  She is able to run without any pain.  But she does have some achiness, awareness of her left medial knee at rest.  She denies any new injuries, traumas or falls.  She is not taking any anti-inflammatories.  Past Medical History:  Diagnosis Date   History of anorexia nervosa    History of chicken pox    PONV (postoperative nausea and vomiting)     Current Outpatient Medications on File Prior to Visit  Medication Sig Dispense Refill   colchicine  0.6 MG tablet Take 1 tablet (0.6 mg total) by mouth 2 (two) times daily. 60 tablet 1   meloxicam  (MOBIC ) 15 MG tablet Take 1 tablet (15 mg total) by mouth daily. 30 tablet 0   Multiple Vitamin (MULTIVITAMIN) tablet Take 1 tablet by mouth daily.     nitroGLYCERIN  (NITRODUR - DOSED IN MG/24 HR) 0.2 mg/hr patch Use 1/4 patch daily to the affected area. 30 patch 1   No current facility-administered medications on file prior to visit.    BP 102/70   Ht 5' 7 (1.702 m)   Wt 125 lb (56.7 kg)   BMI 19.58 kg/m        Objective:   Physical Exam:  Gen: NAD, comfortable in exam room Left knee Inspection: No effusion, erythema or warmth Palpation: Tenderness palpation over the medial joint line, as well as the Pez bursa ROM: Full extension to 0 degrees, full flexion to 140 degrees without crepitus Special Tests: Negative anterior posterior drawer, negative valgus and varus, negative McMurray's Neuro: Strength equal in the bilateral lower extremities, sensation intact bilaterally  Limited  Ultrasound of the left knee No effusion in the suprapatellar pouch -improved from previous study Patellar and quadriceps tendons are visualized in LAX and SAX and show no hypoechoic change.  Lateral meniscus is intact with no spurring along the lateral joint line Medial meniscus is intact, with no spurring along the medial joint line Pes bursa evaluated with some fluid in the region of the semitendinosis as well as the gracilis tendons  Ultrasound and interpretation by Krystal Lowing, DO and Helene NOVAK. Fields, MD  Sport insoles evaluated and on the left a scaphoid pad was present.  Running gait analyzed and still showed valgus knee with some pronation on the left.  Assessment/Plan:   Mackenzie Delgado is a 43 y.o. female who was seen today for the following: 1. Left knee pain, unspecified chronicity (Primary) 2. Pes anserinus bursitis of left knee -Overall, patient has been improving and is now able to run -Ultrasound showing some fluid present over the pes bursa -Applied larger scaphoid pad as well as first ray post to left insole -Gait evaluated and had less pronation on the left -Advised patient to ice and apply Voltaren gel to the pes bursa region after runs and as needed -Follow-up as needed   Follow-up/Education:   Return if symptoms worsen or fail to improve.   May return sooner as needed and encouraged to call/e-mail for additional  questions or  worsening symptoms in the interim.  Krystal Lowing, DO Sports Medicine Fellow 10/11/2023 9:52 AM

## 2023-11-16 ENCOUNTER — Other Ambulatory Visit (HOSPITAL_BASED_OUTPATIENT_CLINIC_OR_DEPARTMENT_OTHER): Payer: Self-pay

## 2023-11-16 MED ORDER — FLUZONE 0.5 ML IM SUSY
0.5000 mL | PREFILLED_SYRINGE | Freq: Once | INTRAMUSCULAR | 0 refills | Status: AC
  Filled 2023-11-16: qty 0.5, 1d supply, fill #0

## 2023-11-29 ENCOUNTER — Other Ambulatory Visit: Payer: Self-pay | Admitting: Medical Genetics

## 2023-11-29 DIAGNOSIS — Z006 Encounter for examination for normal comparison and control in clinical research program: Secondary | ICD-10-CM

## 2024-01-02 LAB — GENECONNECT MOLECULAR SCREEN: Genetic Analysis Overall Interpretation: NEGATIVE
